# Patient Record
Sex: Female | Born: 1970 | Race: Asian | Hispanic: No | Marital: Married | Smoking: Never smoker
Health system: Southern US, Community
[De-identification: ages and names within clinical notes are randomized; demographics above are authoritative.]

---

## 2017-01-13 ENCOUNTER — Encounter (HOSPITAL_COMMUNITY): Payer: Self-pay | Admitting: Emergency Medicine

## 2017-01-13 ENCOUNTER — Emergency Department (HOSPITAL_COMMUNITY): Payer: Medicaid Other

## 2017-01-13 ENCOUNTER — Emergency Department (HOSPITAL_COMMUNITY)
Admission: EM | Admit: 2017-01-13 | Discharge: 2017-01-13 | Disposition: A | Discharge status: Home / Self Care | Payer: Medicaid Other | Attending: Emergency Medicine | Admitting: Emergency Medicine

## 2017-01-13 DIAGNOSIS — R1032 Left lower quadrant pain: Secondary | ICD-10-CM

## 2017-01-13 DIAGNOSIS — R079 Chest pain, unspecified: Secondary | ICD-10-CM | POA: Insufficient documentation

## 2017-01-13 LAB — URINALYSIS, ROUTINE W REFLEX MICROSCOPIC
BILIRUBIN URINE: NEGATIVE
Glucose, UA: NEGATIVE mg/dL
Ketones, ur: NEGATIVE mg/dL
Leukocytes, UA: NEGATIVE
NITRITE: NEGATIVE
PROTEIN: NEGATIVE mg/dL
Specific Gravity, Urine: 1.01 (ref 1.005–1.030)
pH: 5 (ref 5.0–8.0)

## 2017-01-13 LAB — CBC
HEMATOCRIT: 41.9 % (ref 36.0–46.0)
Hemoglobin: 14 g/dL (ref 12.0–15.0)
MCH: 30.9 pg (ref 26.0–34.0)
MCHC: 33.4 g/dL (ref 30.0–36.0)
MCV: 92.5 fL (ref 78.0–100.0)
Platelets: 279 10*3/uL (ref 150–400)
RBC: 4.53 MIL/uL (ref 3.87–5.11)
RDW: 12.9 % (ref 11.5–15.5)
WBC: 5.6 10*3/uL (ref 4.0–10.5)

## 2017-01-13 LAB — COMPREHENSIVE METABOLIC PANEL
ALBUMIN: 4.5 g/dL (ref 3.5–5.0)
ALK PHOS: 56 U/L (ref 38–126)
ALT: 12 U/L — ABNORMAL LOW (ref 14–54)
AST: 24 U/L (ref 15–41)
Anion gap: 10 (ref 5–15)
BILIRUBIN TOTAL: 0.8 mg/dL (ref 0.3–1.2)
BUN: 7 mg/dL (ref 6–20)
CALCIUM: 9.7 mg/dL (ref 8.9–10.3)
CO2: 24 mmol/L (ref 22–32)
Chloride: 107 mmol/L (ref 101–111)
Creatinine, Ser: 0.72 mg/dL (ref 0.44–1.00)
GFR calc Af Amer: >60 mL/min (ref >60)
GFR calc non Af Amer: >60 mL/min (ref >60)
GLUCOSE: 97 mg/dL (ref 65–99)
Potassium: 3.6 mmol/L (ref 3.5–5.1)
Sodium: 141 mmol/L (ref 135–145)
TOTAL PROTEIN: 8.4 g/dL — AB (ref 6.5–8.1)

## 2017-01-13 LAB — I-STAT TROPONIN, ED: TROPONIN I, POC: 0 ng/mL (ref 0.00–0.08)

## 2017-01-13 LAB — I-STAT BETA HCG BLOOD, ED (MC, WL, AP ONLY): I-stat hCG, quantitative: <5 m[IU]/mL (ref <5)

## 2017-01-13 LAB — LIPASE, BLOOD: Lipase: 25 U/L (ref 11–51)

## 2017-01-13 LAB — PREGNANCY, URINE: PREG TEST UR: NEGATIVE

## 2017-01-13 MED ORDER — DICYCLOMINE HCL 20 MG PO TABS: 20.0000 mg | ORAL_TABLET | Freq: Two times a day (BID) | ORAL | 0 refills | Status: DC

## 2017-01-13 MED ORDER — IOPAMIDOL (ISOVUE-300) INJECTION 61%
INTRAVENOUS | Status: AC
  Administered 2017-01-13: 100 mL
  Filled 2017-01-13: qty 100

## 2017-01-13 MED ORDER — MORPHINE SULFATE (PF) 4 MG/ML IV SOLN
4.0000 mg | Freq: Once | INTRAVENOUS | Status: AC
  Administered 2017-01-13: 4 mg via INTRAVENOUS
  Filled 2017-01-13: qty 1

## 2017-01-13 MED ORDER — NAPROXEN 500 MG PO TABS: 500.0000 mg | ORAL_TABLET | Freq: Two times a day (BID) | ORAL | 0 refills | Status: DC

## 2017-01-13 MED ORDER — ONDANSETRON HCL 4 MG/2ML IJ SOLN
4.0000 mg | Freq: Once | INTRAMUSCULAR | Status: AC
  Administered 2017-01-13: 4 mg via INTRAVENOUS
  Filled 2017-01-13: qty 2

## 2017-01-13 NOTE — ED Provider Notes (Signed)
[facility] DEPT Provider Note   CSN: [id number] Arrival date & time: 01/13/17  1237     History   Chief Complaint Chief Complaint  Patient presents with  . Abdominal Pain    HPI Margaret Morales is a 46 y.o. female.  HPI Margaret Morales is a 46 y.o. female Burmese speaking only, with no medical problems, presents to emergency department complaining of abdominal pain. Patient states she has had left lower quadrant abdominal pain for approximately a month. Patient states pain has been constant, sharp. It radiates into the entire abdomen, back, left chest. Patient has not been taking any medications for this pain. He she denies any constipation or diarrhea. Last BM was 2 days ago which is not unusual for her. No nausea or vomiting. Normal appetite. Denies any urinary symptoms or vaginal discharge or bleeding. Last menstrual cycle was one week ago.  She states she took a pregnancy test at home and it was negative. She denies any prior similar symptoms. No prior abdominal surgeries. Currently no PCP.  History reviewed. No pertinent past medical history.  There are no active problems to display for this patient.   No past surgical history on file.  OB History    No data available       Home Medications    Prior to Admission medications   Not on File    Family History No family history on file.  Social History Social History  Substance Use Topics  . Smoking status: Not on file  . Smokeless tobacco: Not on file  . Alcohol use Not on file     Allergies   Patient has no allergy information on record.   Review of Systems Review of Systems  Constitutional: Negative for chills and fever.  Respiratory: Negative for cough, chest tightness and shortness of breath.   Cardiovascular: Negative for chest pain, palpitations and leg swelling.  Gastrointestinal: Positive for abdominal pain. Negative for diarrhea, nausea and vomiting.  Genitourinary: Negative for dysuria, flank pain, pelvic  pain, vaginal bleeding, vaginal discharge and vaginal pain.  Musculoskeletal: Negative for arthralgias, myalgias, neck pain and neck stiffness.  Skin: Negative for rash.  Neurological: Negative for dizziness, weakness and headaches.  All other systems reviewed and are negative.    Physical Exam Updated Vital Signs BP (!) 165/80 (BP Location: Left Arm)   Pulse 69   Temp 98.2 F (36.8 C) (Oral)   Resp 16   LMP 09/13/2016   SpO2 100%   Physical Exam  Constitutional: She is oriented to person, place, and time. She appears well-developed and well-nourished. No distress.  HENT:  Head: Normocephalic.  Eyes: Conjunctivae are normal.  Neck: Neck supple.  Cardiovascular: Normal rate, regular rhythm and normal heart sounds.   Pulmonary/Chest: Effort normal and breath sounds normal. No respiratory distress. She has no wheezes. She has no rales.  Abdominal: Soft. Bowel sounds are normal. She exhibits no distension. There is tenderness. There is no rebound.  Left lower quadrant abdominal tenderness. Left CVA tenderness  Musculoskeletal: She exhibits no edema.  Neurological: She is alert and oriented to person, place, and time.  Skin: Skin is warm and dry.  Psychiatric: She has a normal mood and affect. Her behavior is normal.  Nursing note and vitals reviewed.    ED Treatments / Results  Labs (all labs ordered are listed, but only abnormal results are displayed) Labs Reviewed  COMPREHENSIVE METABOLIC PANEL - Abnormal; Notable for the following:       Result Value  Total Protein 8.4 (*)    ALT 12 (*)    All other components within normal limits  URINALYSIS, ROUTINE W REFLEX MICROSCOPIC - Abnormal; Notable for the following:    Hgb urine dipstick MODERATE (*)    Bacteria, UA RARE (*)    Squamous Epithelial / LPF 0-5 (*)    All other components within normal limits  LIPASE, BLOOD  CBC  PREGNANCY, URINE  POC URINE PREG, ED  I-STAT TROPONIN, ED  I-STAT BETA HCG BLOOD, ED ([facility], [facility],  [facility] ONLY)  I-STAT TROPONIN, ED    EKG  EKG Interpretation  Date/Time:  Thursday January 13 2017 18:21:02 EDT Ventricular Rate:  67 PR Interval:    QRS Duration: 89 QT Interval:  434 QTC Calculation: 459 R Axis:   83 Text Interpretation:  Sinus rhythm No old tracing to compare Confirmed by Linwood Dibbles [phone]) on 01/13/2017 7:07:27 PM Also confirmed by Linwood Dibbles [phone]), editor Elita Quick ([id number])  on 01/14/2017 8:04:32 AM       Radiology Ct Abdomen Pelvis W Contrast  Result Date: 01/13/2017 CLINICAL DATA:  Lower abdominal pain and tenderness for 1 month. EXAM: CT ABDOMEN AND PELVIS WITH CONTRAST TECHNIQUE: Multidetector CT imaging of the abdomen and pelvis was performed using the standard protocol following bolus administration of intravenous contrast. CONTRAST:  ISOVUE-300 IOPAMIDOL (ISOVUE-300) INJECTION 61% COMPARISON:  None. FINDINGS: Lower Chest: No acute findings. Hepatobiliary: No hepatic masses identified. Gallbladder is unremarkable. Pancreas:  No mass or inflammatory changes. Spleen: Within normal limits in size and appearance. Adrenals/Urinary Tract: No masses identified. No evidence of hydronephrosis. Stomach/Bowel: No evidence of obstruction, inflammatory process or abnormal fluid collections. Normal appendix visualized. Vascular/Lymphatic: No pathologically enlarged lymph nodes. No abdominal aortic aneurysm. Reproductive:  No mass or other significant abnormality. Other:  None. Musculoskeletal:  No suspicious bone lesions identified. IMPRESSION: Negative.  No acute findings or other significant abnormality. Electronically Signed   By: Myles Rosenthal M.D.   On: 01/13/2017 19:41    Procedures Procedures (including critical care time)  Medications Ordered in ED Medications  morphine 4 MG/ML injection 4 mg (not administered)  ondansetron (ZOFRAN) injection 4 mg (not administered)     Initial Impression / Assessment and Plan / ED Course  I have reviewed the triage  vital signs and the nursing notes.  Pertinent labs & imaging results that were available during my care of the patient were reviewed by me and considered in my medical decision making (see chart for details).     Patient with left-sided abdominal pain for a month. Labs obtained in triage unremarkable. Urinalysis negative other than moderate hemoglobin and urine. Will add a urine pregnancy test. Patient is also complaining of some chest pain, will add EKG and troponin. Pain medications, antiemetics, CT abdomen and pelvis ordered.   CT negative. Question musculoskeletal pain given worsening symptoms with movement. Discussed results with pt again using interpreter. Will try bentyl and NSAIDs. Will have her follow up with PCP. At this time, VS are stable, normal WBC, normal electrolytes. I do not think she needs any further imaging or treatment in ed and stable for outpatient work up.   Vitals:   01/13/17 1348 01/13/17 1851 01/13/17 1945  BP: (!) 165/80 (!) 149/83 (!) 154/90  Pulse: 69 67 81  Resp: 16 16 19   Temp: 98.2 F (36.8 C)    TempSrc: Oral    SpO2: 100% 98% 98%    Final Clinical Impressions(s) / ED Diagnoses   Final diagnoses:  Left lower quadrant pain    New Prescriptions Discharge Medication List as of 01/13/2017  8:02 PM    START taking these medications   Details  dicyclomine (BENTYL) 20 MG tablet Take 1 tablet (20 mg total) by mouth 2 (two) times daily., Starting Thu 01/13/2017, Print    naproxen (NAPROSYN) 500 MG tablet Take 1 tablet (500 mg total) by mouth 2 (two) times daily., Starting Thu 01/13/2017, Print         Jazsmin Couse, [place], PA-C 01/14/17 1130    Vanetta Mulders, MD 01/17/17 [phone]

## 2017-01-13 NOTE — ED Notes (Signed)
Pt understood dc material. NAD noted. SCripts given at dc. [employer] interp utilized

## 2017-01-13 NOTE — Discharge Instructions (Signed)
Take Naprosyn for pain. Bentyl for abdominal spasms. Please follow-up with the family doctor if symptoms not improving.

## 2017-01-13 NOTE — ED Triage Notes (Addendum)
Pt reports 1 month of left lower abdominal pain, painful upon palpation. Pt also states no menstrual cycle since April. But then states she had bloody discharge August 2nd for several days with pain relief, but pain came back Tuesday. Denies nausea vomiting. Denies painful urination. [employer] Translator used.

## 2017-01-13 NOTE — ED Notes (Signed)
Patient transported to CT on stretcher with tech. 

## 2017-02-09 ENCOUNTER — Other Ambulatory Visit: Payer: Self-pay | Admitting: Obstetrics & Gynecology

## 2017-02-09 DIAGNOSIS — Z1231 Encounter for screening mammogram for malignant neoplasm of breast: Secondary | ICD-10-CM

## 2017-03-10 ENCOUNTER — Encounter (HOSPITAL_COMMUNITY): Payer: Self-pay | Admitting: Emergency Medicine

## 2017-03-10 ENCOUNTER — Emergency Department (HOSPITAL_COMMUNITY)
Admission: EM | Admit: 2017-03-10 | Discharge: 2017-03-11 | Disposition: A | Discharge status: Home / Self Care | Payer: Self-pay | Attending: Emergency Medicine | Admitting: Emergency Medicine

## 2017-03-10 DIAGNOSIS — Z79899 Other long term (current) drug therapy: Secondary | ICD-10-CM | POA: Insufficient documentation

## 2017-03-10 DIAGNOSIS — K5900 Constipation, unspecified: Secondary | ICD-10-CM | POA: Insufficient documentation

## 2017-03-10 DIAGNOSIS — R1084 Generalized abdominal pain: Secondary | ICD-10-CM | POA: Insufficient documentation

## 2017-03-10 LAB — URINALYSIS, ROUTINE W REFLEX MICROSCOPIC
BACTERIA UA: NONE SEEN
BILIRUBIN URINE: NEGATIVE
Glucose, UA: NEGATIVE mg/dL
Ketones, ur: NEGATIVE mg/dL
LEUKOCYTES UA: NEGATIVE
NITRITE: NEGATIVE
PH: 6 (ref 5.0–8.0)
Protein, ur: NEGATIVE mg/dL
RBC / HPF: NONE SEEN RBC/hpf (ref 0–5)
SPECIFIC GRAVITY, URINE: 1.001 — AB (ref 1.005–1.030)

## 2017-03-10 LAB — CBC
HEMATOCRIT: 37.1 % (ref 36.0–46.0)
HEMOGLOBIN: 12.4 g/dL (ref 12.0–15.0)
MCH: 31.4 pg (ref 26.0–34.0)
MCHC: 33.4 g/dL (ref 30.0–36.0)
MCV: 93.9 fL (ref 78.0–100.0)
Platelets: 237 10*3/uL (ref 150–400)
RBC: 3.95 MIL/uL (ref 3.87–5.11)
RDW: 12.5 % (ref 11.5–15.5)
WBC: 4.8 10*3/uL (ref 4.0–10.5)

## 2017-03-10 LAB — COMPREHENSIVE METABOLIC PANEL
ALT: 12 U/L — ABNORMAL LOW (ref 14–54)
ANION GAP: 7 (ref 5–15)
AST: 21 U/L (ref 15–41)
Albumin: 3.9 g/dL (ref 3.5–5.0)
Alkaline Phosphatase: 57 U/L (ref 38–126)
BILIRUBIN TOTAL: 0.8 mg/dL (ref 0.3–1.2)
BUN: 6 mg/dL (ref 6–20)
CHLORIDE: 103 mmol/L (ref 101–111)
CO2: 25 mmol/L (ref 22–32)
Calcium: 9.5 mg/dL (ref 8.9–10.3)
Creatinine, Ser: 0.83 mg/dL (ref 0.44–1.00)
Glucose, Bld: 130 mg/dL — ABNORMAL HIGH (ref 65–99)
POTASSIUM: 3.8 mmol/L (ref 3.5–5.1)
Sodium: 135 mmol/L (ref 135–145)
TOTAL PROTEIN: 7.7 g/dL (ref 6.5–8.1)

## 2017-03-10 LAB — HCG, QUANTITATIVE, PREGNANCY: HCG, BETA CHAIN, QUANT, S: 2 m[IU]/mL (ref <5)

## 2017-03-10 LAB — PREGNANCY, URINE: PREG TEST UR: NEGATIVE

## 2017-03-10 LAB — LIPASE, BLOOD: LIPASE: 28 U/L (ref 11–51)

## 2017-03-10 MED ORDER — IOPAMIDOL (ISOVUE-300) INJECTION 61%
INTRAVENOUS | Status: AC
  Administered 2017-03-11: 100 mL
  Filled 2017-03-10: qty 100

## 2017-03-10 MED ORDER — GI COCKTAIL ~~LOC~~
30.0000 mL | Freq: Once | ORAL | Status: AC
  Administered 2017-03-10: 30 mL via ORAL
  Filled 2017-03-10: qty 30

## 2017-03-10 MED ORDER — MORPHINE SULFATE (PF) 4 MG/ML IV SOLN
4.0000 mg | Freq: Once | INTRAVENOUS | Status: AC
  Administered 2017-03-10: 4 mg via INTRAVENOUS
  Filled 2017-03-10: qty 1

## 2017-03-10 MED ORDER — SODIUM CHLORIDE 0.9 % IV BOLUS (SEPSIS)
1000.0000 mL | Freq: Once | INTRAVENOUS | Status: AC
  Administered 2017-03-10: 1000 mL via INTRAVENOUS

## 2017-03-10 NOTE — ED Provider Notes (Signed)
[facility] Provider Note   CSN: [id number] Arrival date & time: 03/10/17  1657     History   Chief Complaint Chief Complaint  Patient presents with  . Abdominal Pain    HPI Margaret Morales is a 46 y.o. female.  The history is provided by the patient, medical records and a relative. The history is limited by a language barrier.  Abdominal Pain   This is a chronic problem. The current episode started more than 1 week ago. The problem occurs constantly. The problem has not changed since onset.The pain is associated with eating. The pain is located in the generalized abdominal region. The quality of the pain is aching, cramping, sharp and burning. The pain is moderate. Associated symptoms include constipation. Pertinent negatives include fever, diarrhea, melena, nausea, vomiting, dysuria, frequency and headaches. The symptoms are aggravated by palpation and eating (walking). Nothing relieves the symptoms.    History reviewed. No pertinent past medical history.  There are no active problems to display for this patient.   History reviewed. No pertinent surgical history.  OB History    No data available       Home Medications    Prior to Admission medications   Medication Sig Start Date End Date Taking? Authorizing Provider  dicyclomine (BENTYL) 20 MG tablet Take 1 tablet (20 mg total) by mouth 2 (two) times daily. 01/13/17   Kirichenko, Lemont Fillers, PA-C  Multiple Vitamin (MULTI-VITAMIN DAILY PO) Take 1 tablet by mouth daily.    [provider]  naproxen (NAPROSYN) 500 MG tablet Take 1 tablet (500 mg total) by mouth 2 (two) times daily. 01/13/17   Jaynie Crumble, PA-C    Family History History reviewed. No pertinent family history.  Social History Social History  Substance Use Topics  . Smoking status: Never Smoker  . Smokeless tobacco: Never Used  . Alcohol use No     Allergies   Patient has no known allergies.   Review of Systems Review of Systems    Constitutional: Negative for chills, diaphoresis, fatigue and fever.  HENT: Negative for congestion and rhinorrhea.   Respiratory: Negative for cough, chest tightness, shortness of breath and wheezing.   Cardiovascular: Negative for chest pain and palpitations.  Gastrointestinal: Positive for abdominal pain and constipation. Negative for abdominal distention, diarrhea, melena, nausea and vomiting.  Genitourinary: Negative for dysuria, flank pain and frequency.  Musculoskeletal: Negative for back pain, neck pain and neck stiffness.  Skin: Negative for wound.  Neurological: Negative for light-headedness and headaches.  Psychiatric/Behavioral: Negative for agitation.  All other systems reviewed and are negative.    Physical Exam Updated Vital Signs BP (!) 157/85 (BP Location: Left Arm)   Pulse 64   Temp 98.1 F (36.7 C) (Oral)   Resp 18   LMP 09/13/2016   SpO2 100%   Physical Exam  Constitutional: She appears well-developed and well-nourished. No distress.  HENT:  Head: Normocephalic and atraumatic.  Mouth/Throat: Oropharynx is clear and moist. No oropharyngeal exudate.  Eyes: Conjunctivae are normal.  Neck: Neck supple.  Cardiovascular: Normal rate and intact distal pulses.   No murmur heard. Pulmonary/Chest: Effort normal and breath sounds normal. No stridor. No respiratory distress.  Abdominal: Soft. There is generalized tenderness. There is no rigidity, no rebound, no guarding and no CVA tenderness.  Musculoskeletal: She exhibits no edema or tenderness.  Neurological: She is alert. No sensory deficit. She exhibits normal muscle tone.  Skin: Skin is warm and dry. Capillary refill takes less than 2 seconds.  No rash noted. She is not diaphoretic. No erythema.  Psychiatric: She has a normal mood and affect.  Nursing note and vitals reviewed.    ED Treatments / Results  Labs (all labs ordered are listed, but only abnormal results are displayed) Labs Reviewed   COMPREHENSIVE METABOLIC PANEL - Abnormal; Notable for the following:       Result Value   Glucose, Bld 130 (*)    ALT 12 (*)    All other components within normal limits  URINALYSIS, ROUTINE W REFLEX MICROSCOPIC - Abnormal; Notable for the following:    Color, Urine COLORLESS (*)    Specific Gravity, Urine 1.001 (*)    Hgb urine dipstick SMALL (*)    Squamous Epithelial / LPF 0-5 (*)    All other components within normal limits  LIPASE, BLOOD  CBC  HCG, QUANTITATIVE, PREGNANCY  PREGNANCY, URINE    EKG  EKG Interpretation None       Radiology No results found.  Procedures Procedures (including critical care time)  Medications Ordered in ED Medications  sodium chloride 0.9 % bolus 1,000 mL (0 mLs Intravenous Stopped 03/10/17 2238)  gi cocktail (Maalox,Lidocaine,Donnatal) (30 mLs Oral Given 03/10/17 2128)  morphine 4 MG/ML injection 4 mg (4 mg Intravenous Given 03/10/17 2128)     Initial Impression / Assessment and Plan / ED Course  I have reviewed the triage vital signs and the nursing notes.  Pertinent labs & imaging results that were available during my care of the patient were reviewed by me and considered in my medical decision making (see chart for details).     Margaret Morales is a 46 y.o. female with no significant past medical history who presents with abdominal pain and constipation. Patient says that she has had abdominal pain that has been persistent for the last 3 months. She says that it is worsened when she eats solid foods. She reports that she has only been drinking fluids and soups for the last month. She says it is painful when she walks or moves. She denies nausea or vomiting. She denies diarrhea or urinary symptoms but does report some constipation. She reports she has been having a bowel movement only once every 3 or 4 days which is abnormal for her. She denies vaginal discharge or vaginal bleeding. She describes the pain as cramping and burning all across her  abdomen. She reports that she was seen within the last 2 months and had left lower quadrant pain at that time. She says it isn't spread all across her abdomen. She has not had any relief from home medications.  On exam, patient has diffuse abdominal tenderness. Patient has no CVA tenderness. Patients lungs were clear and chest was nontender. No focal neurologic deficits.   Based on the patient's tenderness on exam, patient will have laboratory testing and imaging performed. Patient will be given GI cocktail and pain medications.  Patient's initial laboratory testing showed a normal lipase. CBC unremarkable. CMP showed no significant molecular antibodies or kidney dysfunction. LFTs not elevated. Urinalysis showed no evidence of infection but there was some small hemoglobin. Regnancy test negative.   Patient will have CT scan to further evaluate while her symptom management medications are provided.     Care transferred to Dr. Judd Lien while awaiting CT results. If CT is reassuring, anticipate pt stable for discharge home. Care transferred ins table condition.     Final Clinical Impressions(s) / ED Diagnoses   Final diagnoses:  Generalized abdominal pain  Clinical Impression: 1. Generalized abdominal pain     Disposition: Care transferred to Dr. Judd Lien.     Zoila Ditullio, Canary Brim, MD 03/11/17 1014

## 2017-03-10 NOTE — ED Notes (Signed)
Pt states abdominal pain has been going on for 3 months.  She has only been eating/drinking liquids because she says solid foods upset her stomach and makes it hurt.  Denies dysuria.  Denies N/V/D.  States she only has a BM every 3-4 days.

## 2017-03-10 NOTE — ED Triage Notes (Signed)
Pt to ER for evaluation of abdominal pain x3 months. Denies nausea, vomiting, or diarrhea. States was seen here in July for same.

## 2017-03-11 ENCOUNTER — Emergency Department (HOSPITAL_COMMUNITY): Payer: Self-pay

## 2017-03-11 NOTE — Discharge Instructions (Signed)
Tylenol 1000 mg every 6 hours as needed for pain.  Return to the emergency department if you develop worsening pain, high fever, bloody stools, or other new and concerning symptoms.

## 2017-03-11 NOTE — ED Provider Notes (Signed)
Care assumed from Dr. Rush Landmark at shift change. Patient presents here with ongoing abdominal pain for the past several months. A CT scan was obtained to rule out any significant intra-abdominal pathology. This was performed and was normal. I see no indication for further workup. She will be discharged, to return as needed for any problems.   Geoffery Lyons, MD 03/11/17 [phone]

## 2018-11-19 ENCOUNTER — Encounter (HOSPITAL_COMMUNITY): Payer: Self-pay | Admitting: Emergency Medicine

## 2018-11-19 ENCOUNTER — Ambulatory Visit (HOSPITAL_COMMUNITY)
Admission: EM | Admit: 2018-11-19 | Discharge: 2018-11-19 | Disposition: A | Discharge status: Home / Self Care | Payer: Medicaid Other | Attending: Emergency Medicine | Admitting: Emergency Medicine

## 2018-11-19 DIAGNOSIS — K219 Gastro-esophageal reflux disease without esophagitis: Secondary | ICD-10-CM

## 2018-11-19 MED ORDER — ALUM & MAG HYDROXIDE-SIMETH 200-200-20 MG/5ML PO SUSP
ORAL | Status: AC
  Filled 2018-11-19: qty 30

## 2018-11-19 MED ORDER — LIDOCAINE VISCOUS HCL 2 % MT SOLN
OROMUCOSAL | Status: AC
  Filled 2018-11-19: qty 15

## 2018-11-19 MED ORDER — LIDOCAINE VISCOUS HCL 2 % MT SOLN
15.0000 mL | Freq: Once | OROMUCOSAL | Status: AC
  Administered 2018-11-19: 15 mL via ORAL

## 2018-11-19 MED ORDER — OMEPRAZOLE 20 MG PO CPDR: 20.0000 mg | DELAYED_RELEASE_CAPSULE | Freq: Every day | ORAL | 0 refills | Status: DC

## 2018-11-19 MED ORDER — ALUM & MAG HYDROXIDE-SIMETH 200-200-20 MG/5ML PO SUSP
30.0000 mL | Freq: Once | ORAL | Status: AC
  Administered 2018-11-19: 30 mL via ORAL

## 2018-11-19 NOTE — ED Provider Notes (Signed)
[facility]    CSN: [id number] Arrival date & time: 11/19/18  1625     History   Chief Complaint Chief Complaint  Patient presents with  . Epigastric Pain    HPI Margaret Morales is a 48 y.o. female.   Margaret Morales presents with complaints of epigastric pain which radiates up to throat. Started a week ago, worse since yesterday. Pain is worse when she lays flat, at night. No nasal drainage or cough. Feels like when she lays flat she needs to cough, however. Has tried tylenol which hasn't helped. No nausea, vomiting, constipation or diarrhea. Denies any previous similar. Today feels chilled but no specific fevers. Not currently working. No known ill contacts. No family members exposed to ill contacts. No specific known food triggers. Some ear pain. No back pain, no dizziness, no chest or arm pain, no diaphoresis. Without contributing medical history.     Burmese audio interpreter used to collect history and physical exam.    ROS per HPI, negative if not otherwise mentioned.      History reviewed. No pertinent past medical history.  There are no active problems to display for this patient.   History reviewed. No pertinent surgical history.  OB History   No obstetric history on file.      Home Medications    Prior to Admission medications   Medication Sig Start Date End Date Taking? Authorizing Provider  dicyclomine (BENTYL) 20 MG tablet Take 1 tablet (20 mg total) by mouth 2 (two) times daily. Patient not taking: Reported on 03/10/2017 01/13/17   Jeannett Senior, PA-C  Multiple Vitamin (MULTI-VITAMIN DAILY PO) Take 1 tablet by mouth daily.    [provider]  omeprazole (PRILOSEC) 20 MG capsule Take 1 capsule (20 mg total) by mouth daily. 11/19/18   Zigmund Gottron, NP  omeprazole (PRILOSEC OTC) 20 MG tablet Take 20 mg by mouth 2 (two) times daily.  11/19/18  [provider]    Family History No family history on file.  Social History Social  History   Tobacco Use  . Smoking status: Never Smoker  . Smokeless tobacco: Never Used  Substance Use Topics  . Alcohol use: No  . Drug use: Not on file     Allergies   Patient has no known allergies.   Review of Systems Review of Systems   Physical Exam Triage Vital Signs ED Triage Vitals [11/19/18 1649]  Enc Vitals Group     BP (!) 167/107     Pulse Rate 84     Resp 16     Temp 99 F (37.2 C)     Temp src      SpO2 97 %     Weight      Height      Head Circumference      Peak Flow      Pain Score      Pain Loc      Pain Edu?      Excl. in [place]?    No data found.  Updated Vital Signs BP (!) 167/107   Pulse 84   Temp 99 F (37.2 C)   Resp 16   LMP 09/13/2016   SpO2 97%    Physical Exam Constitutional:      General: She is not in acute distress.    Appearance: She is well-developed.  Cardiovascular:     Rate and Rhythm: Normal rate and regular rhythm.     Heart sounds: Normal heart  sounds.  Pulmonary:     Effort: Pulmonary effort is normal.     Breath sounds: Normal breath sounds.  Abdominal:     Tenderness: There is abdominal tenderness in the epigastric area.  Skin:    General: Skin is warm and dry.  Neurological:     Mental Status: She is alert and oriented to person, place, and time.      UC Treatments / Results  Labs (all labs ordered are listed, but only abnormal results are displayed) Labs Reviewed - No data to display  EKG None  Radiology No results found.  Procedures Procedures (including critical care time)  Medications Ordered in UC Medications  alum & mag hydroxide-simeth (MAALOX/MYLANTA) 200-200-20 MG/5ML suspension 30 mL (30 mLs Oral Given 11/19/18 1720)    And  lidocaine (XYLOCAINE) 2 % viscous mouth solution 15 mL (15 mLs Oral Given 11/19/18 1720)  alum & mag hydroxide-simeth (MAALOX/MYLANTA) 200-200-20 MG/5ML suspension (has no administration in time range)  lidocaine (XYLOCAINE) 2 % viscous mouth solution (has no  administration in time range)    Initial Impression / Assessment and Plan / UC Course  I have reviewed the triage vital signs and the nursing notes.  Pertinent labs & imaging results that were available during my care of the patient were reviewed by me and considered in my medical decision making (see chart for details).     H&P consistent with reflux symptoms. Per chart review has had omeprazole in the past, no longer taking. Gi cocktail provided in clinic today to restart omeprazole. Dietary recommendations provided. Low risk for covid, low risk acs. Return precautions provided. Patient verbalized understanding and agreeable to plan.   Final Clinical Impressions(s) / UC Diagnoses   Final diagnoses:  Gastroesophageal reflux disease, esophagitis presence not specified   Discharge Instructions   None    ED Prescriptions    Medication Sig Dispense Auth. Provider   omeprazole (PRILOSEC) 20 MG capsule Take 1 capsule (20 mg total) by mouth daily. 30 capsule Georgetta HaberBurky, Natalie B, NP     Controlled Substance Prescriptions [place] Controlled Substance Registry consulted? Not Applicable   Georgetta HaberBurky, Natalie B, NP 11/19/18 1733

## 2018-11-19 NOTE — ED Triage Notes (Signed)
Pt c/o sore throat, tickle in her throat, for a week, pt c/o burning in her neck. Pt states "when I open my mouth the heat goes out".

## 2018-12-01 ENCOUNTER — Ambulatory Visit (INDEPENDENT_AMBULATORY_CARE_PROVIDER_SITE_OTHER): Payer: Self-pay

## 2018-12-01 ENCOUNTER — Other Ambulatory Visit: Payer: Self-pay

## 2018-12-01 ENCOUNTER — Encounter (HOSPITAL_COMMUNITY): Payer: Self-pay

## 2018-12-01 ENCOUNTER — Ambulatory Visit (HOSPITAL_COMMUNITY)
Admission: EM | Admit: 2018-12-01 | Discharge: 2018-12-01 | Disposition: A | Discharge status: Home / Self Care | Payer: Self-pay | Attending: Internal Medicine | Admitting: Internal Medicine

## 2018-12-01 DIAGNOSIS — Z72820 Sleep deprivation: Secondary | ICD-10-CM

## 2018-12-01 DIAGNOSIS — K219 Gastro-esophageal reflux disease without esophagitis: Secondary | ICD-10-CM

## 2018-12-01 MED ORDER — ESOMEPRAZOLE MAGNESIUM 40 MG PO CPDR: 40.0000 mg | DELAYED_RELEASE_CAPSULE | Freq: Every day | ORAL | 0 refills | Status: DC

## 2018-12-01 MED ORDER — DIPHENHYDRAMINE HCL 25 MG PO TABS: 25.0000 mg | ORAL_TABLET | Freq: Every evening | ORAL | 0 refills | Status: DC | PRN

## 2018-12-01 MED ORDER — FAMOTIDINE 20 MG PO TABS: 20.0000 mg | ORAL_TABLET | Freq: Every day | ORAL | 0 refills | Status: DC

## 2018-12-01 MED ORDER — ALUM & MAG HYDROXIDE-SIMETH 200-200-20 MG/5ML PO SUSP
ORAL | Status: AC
  Filled 2018-12-01: qty 30

## 2018-12-01 MED ORDER — LIDOCAINE VISCOUS HCL 2 % MT SOLN
15.0000 mL | Freq: Once | OROMUCOSAL | Status: AC
  Administered 2018-12-01: 15 mL via ORAL

## 2018-12-01 MED ORDER — ALUM & MAG HYDROXIDE-SIMETH 200-200-20 MG/5ML PO SUSP
30.0000 mL | Freq: Once | ORAL | Status: AC
  Administered 2018-12-01: 30 mL via ORAL

## 2018-12-01 MED ORDER — LIDOCAINE VISCOUS HCL 2 % MT SOLN
OROMUCOSAL | Status: AC
  Filled 2018-12-01: qty 15

## 2018-12-01 NOTE — ED Triage Notes (Signed)
Patient presents to Urgent Care with complaints of not being able to sleep for three weeks since feeling like her throat and stomach is "hot". Patient reports she has been too uncomfortable to sleep well.

## 2018-12-01 NOTE — Discharge Instructions (Addendum)
Your chest xray is normal today which is reassuring.  It sounds like reflux is still contributing to your chest pain and burning.  We will try switching your regimen to try to get better control.  Nexium once a day in the morning. Pepcid at night before bed. Benadryl as needed to help with sleep.  See diet recommendations provided.  Please follow up with PCP as may need further evaluation if symptoms persist.

## 2018-12-01 NOTE — ED Provider Notes (Signed)
[facility]    CSN: [id number] Arrival date & time: 12/01/18  1024     History   Chief Complaint Chief Complaint  Patient presents with  . Insomnia    HPI Margaret Morales is a 48 y.o. female.   Margaret Morales presents with complaints of chest burning and throat itching, which is much worse at night, and has been ongoing for the past three weeks. Was seen here and started on omeprazole 6/14. Symptoms have improved during the day but still with symptoms at night, keeping her up at night. No headache. No fevers. No leg swelling. No cough. Shortness of breath sensation with laying flat. Occasional cough. Doesn't follow with a PCP. Has some burning pain now even. No nausea or vomiting. Doesn't smoke. No palpitations.    Burmese audio interpreter used to collect history and physical exam.    ROS per HPI, negative if not otherwise mentioned.      History reviewed. No pertinent past medical history.  There are no active problems to display for this patient.   History reviewed. No pertinent surgical history.  OB History   No obstetric history on file.      Home Medications    Prior to Admission medications   Medication Sig Start Date End Date Taking? Authorizing Provider  diphenhydrAMINE (BENADRYL) 25 MG tablet Take 1 tablet (25 mg total) by mouth at bedtime as needed for sleep. 12/01/18   Georgetta HaberBurky, Fionnuala Hemmerich B, NP  esomeprazole (NEXIUM) 40 MG capsule Take 1 capsule (40 mg total) by mouth daily. 12/01/18   Georgetta HaberBurky, Gaylon Bentz B, NP  famotidine (PEPCID) 20 MG tablet Take 1 tablet (20 mg total) by mouth at bedtime. 12/01/18   Georgetta HaberBurky, Jenaro Souder B, NP  Multiple Vitamin (MULTI-VITAMIN DAILY PO) Take 1 tablet by mouth daily.    [provider]  dicyclomine (BENTYL) 20 MG tablet Take 1 tablet (20 mg total) by mouth 2 (two) times daily. Patient not taking: Reported on 03/10/2017 01/13/17 12/01/18  Jaynie CrumbleKirichenko, Tatyana, PA-C  omeprazole (PRILOSEC OTC) 20 MG tablet Take 20 mg by mouth 2 (two) times  daily.  11/19/18  [provider]  omeprazole (PRILOSEC) 20 MG capsule Take 1 capsule (20 mg total) by mouth daily. 11/19/18 12/01/18  Georgetta HaberBurky, Toiya Morrish B, NP    Family History Family History  Family history unknown: Yes    Social History Social History   Tobacco Use  . Smoking status: Never Smoker  . Smokeless tobacco: Never Used  Substance Use Topics  . Alcohol use: No  . Drug use: Not on file     Allergies   Patient has no known allergies.   Review of Systems Review of Systems   Physical Exam Triage Vital Signs ED Triage Vitals  Enc Vitals Group     BP 12/01/18 1105 (!) 153/82     Pulse Rate 12/01/18 1105 73     Resp 12/01/18 1105 17     Temp 12/01/18 1105 98.5 F (36.9 C)     Temp Source 12/01/18 1105 Oral     SpO2 12/01/18 1105 98 %     Weight --      Height --      Head Circumference --      Peak Flow --      Pain Score 12/01/18 1307 5     Pain Loc --      Pain Edu? --      Excl. in GC? --    No data found.  Updated  Vital Signs BP (!) 153/82 (BP Location: Right Arm)   Pulse 73   Temp 98.5 F (36.9 C) (Oral)   Resp 17   LMP 09/13/2016   SpO2 98%   Visual Acuity Right Eye Distance:   Left Eye Distance:   Bilateral Distance:    Right Eye Near:   Left Eye Near:    Bilateral Near:     Physical Exam Constitutional:      General: She is not in acute distress.    Appearance: She is well-developed.  Cardiovascular:     Rate and Rhythm: Normal rate.  Pulmonary:     Effort: Pulmonary effort is normal.  Abdominal:     Tenderness: There is abdominal tenderness in the epigastric area.  Skin:    General: Skin is warm and dry.  Neurological:     Mental Status: She is alert and oriented to person, place, and time.      UC Treatments / Results  Labs (all labs ordered are listed, but only abnormal results are displayed) Labs Reviewed - No data to display  EKG None  Radiology Dg Chest 2 View  Result Date: 12/01/2018 CLINICAL DATA:   Chest pain. EXAM: CHEST - 2 VIEW COMPARISON:  02/09/2008. FINDINGS: Mediastinum and hilar structures normal. Low lung volumes with mild bibasilar atelectasis. No pleural effusion or pneumothorax. Heart size normal. No acute bony abnormality. IMPRESSION: No acute cardiopulmonary disease. Electronically Signed   By: Marcello Moores  Register   On: 12/01/2018 12:26    Procedures Procedures (including critical care time)  Medications Ordered in UC Medications  alum & mag hydroxide-simeth (MAALOX/MYLANTA) 200-200-20 MG/5ML suspension 30 mL (30 mLs Oral Given 12/01/18 1150)    And  lidocaine (XYLOCAINE) 2 % viscous mouth solution 15 mL (15 mLs Oral Given 12/01/18 1150)  alum & mag hydroxide-simeth (MAALOX/MYLANTA) 200-200-20 MG/5ML suspension (has no administration in time range)  lidocaine (XYLOCAINE) 2 % viscous mouth solution (has no administration in time range)    Initial Impression / Assessment and Plan / UC Course  I have reviewed the triage vital signs and the nursing notes.  Pertinent labs & imaging results that were available during my care of the patient were reviewed by me and considered in my medical decision making (see chart for details).     Chest xray reassuring. Vitals stable. Non toxic. Omeprazole has helped some. Will try daily nexium and pepcid qhs. Benadryl prn. May need h.pylori testing, emphasized to establish with PCP. Patient verbalized understanding and agreeable to plan.   Final Clinical Impressions(s) / UC Diagnoses   Final diagnoses:  Gastroesophageal reflux disease, esophagitis presence not specified  Poor sleep     Discharge Instructions     Your chest xray is normal today which is reassuring.  It sounds like reflux is still contributing to your chest pain and burning.  We will try switching your regimen to try to get better control.  Nexium once a day in the morning. Pepcid at night before bed. Benadryl as needed to help with sleep.  See diet recommendations  provided.  Please follow up with PCP as may need further evaluation if symptoms persist.     ED Prescriptions    Medication Sig Dispense Auth. Provider   esomeprazole (NEXIUM) 40 MG capsule Take 1 capsule (40 mg total) by mouth daily. 30 capsule Augusto Gamble B, NP   famotidine (PEPCID) 20 MG tablet Take 1 tablet (20 mg total) by mouth at bedtime. 30 tablet Zigmund Gottron, NP  diphenhydrAMINE (BENADRYL) 25 MG tablet Take 1 tablet (25 mg total) by mouth at bedtime as needed for sleep. 20 tablet Georgetta HaberBurky, Leocadia Idleman B, NP     Controlled Substance Prescriptions [place] Controlled Substance Registry consulted? Not Applicable   Georgetta HaberBurky, Aubryn Spinola B, NP 12/01/18 1312

## 2018-12-25 ENCOUNTER — Encounter (HOSPITAL_COMMUNITY): Payer: Self-pay

## 2018-12-25 ENCOUNTER — Other Ambulatory Visit: Payer: Self-pay

## 2018-12-25 ENCOUNTER — Ambulatory Visit (HOSPITAL_COMMUNITY)
Admission: EM | Admit: 2018-12-25 | Discharge: 2018-12-25 | Disposition: A | Discharge status: Home / Self Care | Payer: Medicaid Other | Attending: Family Medicine | Admitting: Family Medicine

## 2018-12-25 DIAGNOSIS — R1013 Epigastric pain: Secondary | ICD-10-CM

## 2018-12-25 DIAGNOSIS — G8929 Other chronic pain: Secondary | ICD-10-CM

## 2018-12-25 MED ORDER — OMEPRAZOLE 20 MG PO CPDR: 20.0000 mg | DELAYED_RELEASE_CAPSULE | Freq: Every day | ORAL | 0 refills | Status: DC

## 2018-12-25 MED ORDER — SUCRALFATE 1 G PO TABS: 1.0000 g | ORAL_TABLET | Freq: Three times a day (TID) | ORAL | 0 refills | Status: DC

## 2018-12-25 NOTE — ED Triage Notes (Signed)
Pt presents for medication refill on medication for recurrent abdominal pain .

## 2018-12-25 NOTE — Discharge Instructions (Signed)
Take the omeprazole 2 x a day morning and night.  This reduces stomach acid  Call tomorrow to set appointment for internal medicine doctor .  You need to see a doctor on an ongoing basis for this problem.  It cannot be thoroughly evaluated at an ER or urgent care.  You may need a specialist.

## 2018-12-25 NOTE — ED Provider Notes (Signed)
[facility]    CSN: [id number] Arrival date & time: 12/25/18  1526      History   Chief Complaint Chief Complaint  Patient presents with  . Medication Refill  . Abdominal Pain    HPI Margaret Morales is a 48 y.o. female.   HPI  Patient has chronic abdominal pain.  This is her fourth or fifth visit to the urgent care center in ER for same.  She has had 2 CT scans which were negative.  She is had blood work which is normal.  She has been tried on Bentyl, omeprazole, Nexium, Pepcid.  She complains of epigastric pain.  She states her stomach hurts if she eats.  It hurts if she does not.  It hurts at night.  She puts her fist in her epigastrium and and presses to indicate she has pressure in this area.  Her son is here to help interpret.  We are unable to reach a Burmese interpreter on the usual translating lines. No loss of weight.  No nausea or vomiting.  No diarrhea.  No occasional constipation. She has been avoiding spicy foods.  Fatty foods.  Concentrated sweets. She states that 1 of the medicines it was given to her.  To help.  She is requesting something inexpensive since she does not have insurance.  History reviewed. No pertinent past medical history.  There are no active problems to display for this patient.   History reviewed. No pertinent surgical history.  OB History   No obstetric history on file.      Home Medications    Prior to Admission medications   Medication Sig Start Date End Date Taking? Authorizing Provider  diphenhydrAMINE (BENADRYL) 25 MG tablet Take 1 tablet (25 mg total) by mouth at bedtime as needed for sleep. 12/01/18   Georgetta HaberBurky, Natalie B, NP  esomeprazole (NEXIUM) 40 MG capsule Take 1 capsule (40 mg total) by mouth daily. 12/01/18   Georgetta HaberBurky, Natalie B, NP  famotidine (PEPCID) 20 MG tablet Take 1 tablet (20 mg total) by mouth at bedtime. 12/01/18   Georgetta HaberBurky, Natalie B, NP  Multiple Vitamin (MULTI-VITAMIN DAILY PO) Take 1 tablet by mouth daily.     [provider]  omeprazole (PRILOSEC) 20 MG capsule Take 1 capsule (20 mg total) by mouth daily. 12/25/18   Eustace MooreNelson, Kaymarie Wynn Sue, MD  dicyclomine (BENTYL) 20 MG tablet Take 1 tablet (20 mg total) by mouth 2 (two) times daily. Patient not taking: Reported on 03/10/2017 01/13/17 12/01/18  Jaynie CrumbleKirichenko, Tatyana, PA-C  omeprazole (PRILOSEC OTC) 20 MG tablet Take 20 mg by mouth 2 (two) times daily.  11/19/18  [provider]  sucralfate (CARAFATE) 1 g tablet Take 1 tablet (1 g total) by mouth 4 (four) times daily -  with meals and at bedtime. 12/25/18 12/25/18  Eustace MooreNelson, Lilana Blasko Sue, MD    Family History Family History  Family history unknown: Yes    Social History Social History   Tobacco Use  . Smoking status: Never Smoker  . Smokeless tobacco: Never Used  Substance Use Topics  . Alcohol use: No  . Drug use: Not on file     Allergies   Patient has no known allergies.   Review of Systems Review of Systems  Constitutional: Negative for chills and fever.  HENT: Negative for ear pain and sore throat.   Eyes: Negative for pain and visual disturbance.  Respiratory: Negative for cough and shortness of breath.   Cardiovascular: Negative for chest pain and  palpitations.  Gastrointestinal: Positive for abdominal pain. Negative for vomiting.  Genitourinary: Negative for dysuria and hematuria.  Musculoskeletal: Negative for arthralgias and back pain.  Skin: Negative for color change and rash.  Neurological: Negative for seizures and syncope.  All other systems reviewed and are negative.    Physical Exam Triage Vital Signs ED Triage Vitals  Enc Vitals Group     BP 12/25/18 1614 (!) 150/96     Pulse Rate 12/25/18 1614 81     Resp 12/25/18 1614 17     Temp 12/25/18 1614 98.7 F (37.1 C)     Temp Source 12/25/18 1614 Oral     SpO2 12/25/18 1614 99 %     Weight --      Height --      Head Circumference --      Peak Flow --      Pain Score 12/25/18 1629 6     Pain Loc --       Pain Edu? --      Excl. in GC? --    No data found.  Updated Vital Signs BP (!) 150/96 (BP Location: Left Arm)   Pulse 81   Temp 98.7 F (37.1 C) (Oral)   Resp 17   LMP 09/13/2016   SpO2 99%     Physical Exam Constitutional:      General: She is not in acute distress.    Appearance: She is well-developed. She is obese.  HENT:     Head: Normocephalic and atraumatic.  Eyes:     Conjunctiva/sclera: Conjunctivae normal.     Pupils: Pupils are equal, round, and reactive to light.  Neck:     Musculoskeletal: Normal range of motion.  Cardiovascular:     Rate and Rhythm: Normal rate and regular rhythm.     Heart sounds: Normal heart sounds.  Pulmonary:     Effort: Pulmonary effort is normal. No respiratory distress.     Breath sounds: Normal breath sounds.  Abdominal:     General: Abdomen is flat. Bowel sounds are normal. There is no distension.     Palpations: Abdomen is soft. There is no hepatomegaly, splenomegaly or mass.     Tenderness: There is no abdominal tenderness.     Hernia: No hernia is present.  Musculoskeletal: Normal range of motion.  Skin:    General: Skin is warm and dry.  Neurological:     Mental Status: She is alert.      UC Treatments / Results  Labs (all labs ordered are listed, but only abnormal results are displayed) Labs Reviewed - No data to display  EKG   Radiology No results found.  Procedures Procedures (including critical care time)  Medications Ordered in UC Medications - No data to display  Initial Impression / Assessment and Plan / UC Course  I have reviewed the triage vital signs and the nursing notes.  Pertinent labs & imaging results that were available during my care of the patient were reviewed by me and considered in my medical decision making (see chart for details).     Patient complains of pain that is been longer than a year.  Multiple medicines have been tried.  2 normal CAT scans.  She is nontoxic and in no  acute distress.  She has absolutely no tenderness or findings on physical examination.  I explained the importance of establishing with a primary care doctor in order to get additional evaluation and testing.  She may need a  specialty evaluation.  She may need endoscopy. Final Clinical Impressions(s) / UC Diagnoses   Final diagnoses:  Abdominal pain, chronic, epigastric     Discharge Instructions     Take the omeprazole 2 x a day morning and night.  This reduces stomach acid  Call tomorrow to set appointment for internal medicine doctor .  You need to see a doctor on an ongoing basis for this problem.  It cannot be thoroughly evaluated at an ER or urgent care.  You may need a specialist.   ED Prescriptions    Medication Sig Dispense Auth. Provider   omeprazole (PRILOSEC) 20 MG capsule Take 1 capsule (20 mg total) by mouth daily. 60 capsule Raylene Everts, MD     Controlled Substance Prescriptions [place] Controlled Substance Registry consulted? Not Applicable   Raylene Everts, MD 12/25/18 1810

## 2020-04-26 ENCOUNTER — Encounter (HOSPITAL_COMMUNITY): Payer: Self-pay

## 2020-04-26 ENCOUNTER — Ambulatory Visit (HOSPITAL_COMMUNITY)
Admission: EM | Admit: 2020-04-26 | Discharge: 2020-04-26 | Disposition: A | Discharge status: Home / Self Care | Payer: BLUE CROSS/BLUE SHIELD | Attending: Family Medicine | Admitting: Family Medicine

## 2020-04-26 ENCOUNTER — Other Ambulatory Visit: Payer: Self-pay

## 2020-04-26 DIAGNOSIS — K645 Perianal venous thrombosis: Secondary | ICD-10-CM

## 2020-04-26 MED ORDER — METRONIDAZOLE 1 % EX GEL: Freq: Two times a day (BID) | CUTANEOUS | 0 refills | Status: DC

## 2020-04-26 MED ORDER — HYDROCORTISONE (PERIANAL) 2.5 % EX CREA: 1.0000 "application " | TOPICAL_CREAM | Freq: Two times a day (BID) | CUTANEOUS | 2 refills | Status: DC

## 2020-04-26 MED ORDER — HYDROCODONE-ACETAMINOPHEN 5-325 MG PO TABS: 1.0000 | ORAL_TABLET | Freq: Three times a day (TID) | ORAL | 0 refills | Status: DC | PRN

## 2020-04-26 NOTE — ED Triage Notes (Signed)
Pt reports having rectal pain x 1 week. States having hemorrhoids, is bleeding x 1 week.  Pt wants to know if she can have surgery, to relief the pain,   " Sometimes I want to cry". OTC cream gives no relief.

## 2020-04-26 NOTE — ED Provider Notes (Signed)
[facility]    CSN: [id number] Arrival date & time: 04/26/20  1317      History   Chief Complaint Chief Complaint  Patient presents with  . Hemorrhoids    HPI Margaret Morales is a 49 y.o. female.   Medical translator used today to help facilitate visit with patient's consent. Patient presenting today with about a week of worsening swollen, painful and bleeding hemorrhoids. States it is painful with each BM and there is some blood when wiping each time but nothing between BMs. Has been trying prep H ointment without benefit. Pain is severe and she is unable to sit on bottom at this point. Denies fever, abdominal pain, fatigue, weakness, N/V/D.      History reviewed. No pertinent past medical history.  There are no problems to display for this patient.   History reviewed. No pertinent surgical history.  OB History   No obstetric history on file.      Home Medications    Prior to Admission medications   Medication Sig Start Date End Date Taking? Authorizing Provider  diphenhydrAMINE (BENADRYL) 25 MG tablet Take 1 tablet (25 mg total) by mouth at bedtime as needed for sleep. 12/01/18   Georgetta Haber, NP  esomeprazole (NEXIUM) 40 MG capsule Take 1 capsule (40 mg total) by mouth daily. 12/01/18   Georgetta Haber, NP  famotidine (PEPCID) 20 MG tablet Take 1 tablet (20 mg total) by mouth at bedtime. 12/01/18   Georgetta Haber, NP  HYDROcodone-acetaminophen (NORCO/VICODIN) 5-325 MG tablet Take 1 tablet by mouth 3 (three) times daily as needed for moderate pain. 04/26/20   Particia Nearing, PA-C  hydrocortisone (ANUSOL-HC) 2.5 % rectal cream Place 1 application rectally 2 (two) times daily. 04/26/20   Particia Nearing, PA-C  metroNIDAZOLE (METROGEL) 1 % gel Apply topically 2 (two) times daily. Apply to anal area twice daily 04/26/20   Particia Nearing, PA-C  Multiple Vitamin (MULTI-VITAMIN DAILY PO) Take 1 tablet by mouth daily.    [provider]   omeprazole (PRILOSEC) 20 MG capsule Take 1 capsule (20 mg total) by mouth daily. 12/25/18   Eustace Moore, MD  dicyclomine (BENTYL) 20 MG tablet Take 1 tablet (20 mg total) by mouth 2 (two) times daily. Patient not taking: Reported on 03/10/2017 01/13/17 12/01/18  Jaynie Crumble, PA-C  omeprazole (PRILOSEC OTC) 20 MG tablet Take 20 mg by mouth 2 (two) times daily.  11/19/18  [provider]  sucralfate (CARAFATE) 1 g tablet Take 1 tablet (1 g total) by mouth 4 (four) times daily -  with meals and at bedtime. 12/25/18 12/25/18  Eustace Moore, MD    Family History Family History  Family history unknown: Yes    Social History Social History   Tobacco Use  . Smoking status: Never Smoker  . Smokeless tobacco: Never Used  Vaping Use  . Vaping Use: Never used  Substance Use Topics  . Alcohol use: No  . Drug use: Not on file     Allergies   Patient has no known allergies.   Review of Systems Review of Systems PER HPI    Physical Exam Triage Vital Signs ED Triage Vitals  Enc Vitals Group     BP 04/26/20 1355 (!) 172/80     Pulse Rate 04/26/20 1355 66     Resp 04/26/20 1355 18     Temp 04/26/20 1355 98.6 F (37 C)     Temp Source 04/26/20 1355 Oral  SpO2 04/26/20 1355 100 %     Weight --      Height --      Head Circumference --      Peak Flow --      Pain Score 04/26/20 1401 9     Pain Loc --      Pain Edu? --      Excl. in GC? --    No data found.  Updated Vital Signs BP (!) 172/80 (BP Location: Left Arm)   Pulse 66   Temp 98.6 F (37 C) (Oral)   Resp 18   LMP 09/13/2016   SpO2 100%   Visual Acuity Right Eye Distance:   Left Eye Distance:   Bilateral Distance:    Right Eye Near:   Left Eye Near:    Bilateral Near:     Physical Exam Vitals and nursing note reviewed.  Constitutional:      Appearance: Normal appearance. She is not ill-appearing.  HENT:     Head: Atraumatic.  Eyes:     Extraocular Movements: Extraocular  movements intact.     Conjunctiva/sclera: Conjunctivae normal.  Cardiovascular:     Rate and Rhythm: Normal rate and regular rhythm.     Heart sounds: Normal heart sounds.  Pulmonary:     Effort: Pulmonary effort is normal.     Breath sounds: Normal breath sounds.  Abdominal:     General: Bowel sounds are normal. There is no distension.     Palpations: Abdomen is soft.     Tenderness: There is no abdominal tenderness. There is no guarding.  Genitourinary:    Comments: Multiple significantly swollen external hemorrhoids present, several becoming thrombosed. Several areas of ulceration/erosin likely from wiping/irritation around anal area as well. No active bleeding, drainage, erythema Musculoskeletal:        General: Normal range of motion.     Cervical back: Normal range of motion and neck supple.  Skin:    General: Skin is warm and dry.  Neurological:     Mental Status: She is alert and oriented to person, place, and time.  Psychiatric:        Mood and Affect: Mood normal.        Thought Content: Thought content normal.        Judgment: Judgment normal.    UC Treatments / Results  Labs (all labs ordered are listed, but only abnormal results are displayed) Labs Reviewed - No data to display  EKG   Radiology No results found.  Procedures Procedures (including critical care time)  Medications Ordered in UC Medications - No data to display  Initial Impression / Assessment and Plan / UC Course  I have reviewed the triage vital signs and the nursing notes.  Pertinent labs & imaging results that were available during my care of the patient were reviewed by me and considered in my medical decision making (see chart for details).     Will rx anusol, flagyl topicals and send norco for prn use for severe pain. Reviewed sitz baths, tucks wipes, avoiding straining or firm wiping. Gen Surgery information given to patient to call Monday morning and get an appt for further  evaluation and mgmt. Return precautions given  Final Clinical Impressions(s) / UC Diagnoses   Final diagnoses:  Thrombosed hemorrhoids     Discharge Instructions     [facility] [address], [place], [place] [address] [phone]    ED Prescriptions    Medication Sig  Dispense Auth. Provider   metroNIDAZOLE (METROGEL) 1 % gel Apply topically 2 (two) times daily. Apply to anal area twice daily 45 g Particia Nearing, PA-C   hydrocortisone (ANUSOL-HC) 2.5 % rectal cream Place 1 application rectally 2 (two) times daily. 60 g Particia Nearing, [place]   HYDROcodone-acetaminophen (NORCO/VICODIN) 5-325 MG tablet Take 1 tablet by mouth 3 (three) times daily as needed for moderate pain. 15 tablet Particia Nearing, [place]     I have reviewed the PDMP during this encounter.   Particia Nearing, [place] 04/26/20 1544

## 2020-04-26 NOTE — Discharge Instructions (Signed)
[facility] [address], [place], [place] [address] [phone] 

## 2020-06-30 ENCOUNTER — Other Ambulatory Visit: Payer: Self-pay

## 2020-06-30 ENCOUNTER — Ambulatory Visit (INDEPENDENT_AMBULATORY_CARE_PROVIDER_SITE_OTHER): Payer: BLUE CROSS/BLUE SHIELD

## 2020-06-30 DIAGNOSIS — Z23 Encounter for immunization: Secondary | ICD-10-CM

## 2020-06-30 NOTE — Progress Notes (Signed)
   Covid-19 Vaccination Clinic  Name:  Margaret Morales    MRN: [id number] DOB: 1971/02/14  06/30/2020  Margaret Morales was observed post Covid-19 immunization for 15 minutes without incident. She was provided with Vaccine Information Sheet and instruction to access the V-Safe system.   Margaret Morales was instructed to call 911 with any severe reactions post vaccine: Marland Kitchen Difficulty breathing  . Swelling of face and throat  . A fast heartbeat  . A bad rash all over body  . Dizziness and weakness   Immunizations Administered    Name Date Dose VIS Date Route   Pfizer COVID-19 Vaccine 06/30/2020 11:16 AM 0.3 mL 03/26/2020 Intramuscular   Manufacturer: [employer], [employer]   Lot: [id number]   NDC: [address]-8

## 2020-09-29 ENCOUNTER — Ambulatory Visit (HOSPITAL_COMMUNITY)
Admission: EM | Admit: 2020-09-29 | Discharge: 2020-09-29 | Disposition: A | Discharge status: Home / Self Care | Payer: BLUE CROSS/BLUE SHIELD | Attending: Urgent Care | Admitting: Urgent Care

## 2020-09-29 ENCOUNTER — Other Ambulatory Visit: Payer: Self-pay

## 2020-09-29 DIAGNOSIS — R109 Unspecified abdominal pain: Secondary | ICD-10-CM

## 2020-09-29 DIAGNOSIS — G8929 Other chronic pain: Secondary | ICD-10-CM | POA: Diagnosis not present

## 2020-09-29 LAB — POCT URINALYSIS DIPSTICK, ED / UC
Bilirubin Urine: NEGATIVE
Glucose, UA: NEGATIVE mg/dL
Ketones, ur: NEGATIVE mg/dL
Leukocytes,Ua: NEGATIVE
Nitrite: NEGATIVE
Protein, ur: NEGATIVE mg/dL
Specific Gravity, Urine: 1.01 (ref 1.005–1.030)
Urobilinogen, UA: 0.2 mg/dL (ref 0.0–1.0)
pH: 5.5 (ref 5.0–8.0)

## 2020-09-29 LAB — CBG MONITORING, ED: Glucose-Capillary: 86 mg/dL (ref 70–99)

## 2020-09-29 MED ORDER — NAPROXEN 375 MG PO TABS: 375.0000 mg | ORAL_TABLET | Freq: Two times a day (BID) | ORAL | 0 refills | Status: DC

## 2020-09-29 NOTE — ED Provider Notes (Signed)
[facility]   MRN: [id number] DOB: Jul 18, 1970  Subjective:   Margaret Morales is a 50 y.o. female presenting for evaluation for her chronic abdominal pain.  Patient has had persistent intermittent lower abdominal pain for the past 4 to 5 years.  Denies fever, chest pain, shortness of breath, nausea, vomiting, diarrhea, constipation, dysuria, urinary frequency.  Denies bloody stools.  Does not use NSAIDs regularly.  Patient does not have a regular doctor.  Has never been evaluated by a gastroenterologist.  She did have a CT scan of the abdomen pelvis in 2018 for similar abdominal pain.  Denies taking chronic medications.    No Known Allergies  No past medical history on file.   No past surgical history on file.  Family History  Family history unknown: Yes    Social History   Tobacco Use  . Smoking status: Never Smoker  . Smokeless tobacco: Never Used  Vaping Use  . Vaping Use: Never used  Substance Use Topics  . Alcohol use: No    ROS   Objective:   Vitals: BP (!) 168/88 (BP Location: Right Arm)   Pulse 86   Temp 98.5 F (36.9 C) (Oral)   Resp 18   LMP 09/13/2016   SpO2 97%   Physical Exam Constitutional:      General: She is not in acute distress.    Appearance: Normal appearance. She is well-developed and normal weight. She is not ill-appearing, toxic-appearing or diaphoretic.  HENT:     Head: Normocephalic and atraumatic.     Right Ear: External ear normal.     Left Ear: External ear normal.     Nose: Nose normal.     Mouth/Throat:     Mouth: Mucous membranes are moist.     Pharynx: Oropharynx is clear.  Eyes:     General: No scleral icterus.       Right eye: No discharge.        Left eye: No discharge.     Extraocular Movements: Extraocular movements intact.     Conjunctiva/sclera: Conjunctivae normal.     Pupils: Pupils are equal, round, and reactive to light.  Cardiovascular:     Rate and Rhythm: Normal rate and regular rhythm.      Heart sounds: Normal heart sounds. No murmur heard. No friction rub. No gallop.   Pulmonary:     Effort: Pulmonary effort is normal. No respiratory distress.     Breath sounds: Normal breath sounds. No stridor. No wheezing, rhonchi or rales.  Abdominal:     General: Bowel sounds are normal. There is no distension.     Palpations: Abdomen is soft. There is no mass.     Tenderness: There is abdominal tenderness in the right lower quadrant and left lower quadrant. There is no right CVA tenderness, left CVA tenderness, guarding or rebound.  Skin:    General: Skin is warm and dry.     Coloration: Skin is not pale.     Findings: No rash.  Neurological:     General: No focal deficit present.     Mental Status: She is alert and oriented to person, place, and time.  Psychiatric:        Mood and Affect: Mood normal.        Behavior: Behavior normal.        Thought Content: Thought content normal.        Judgment: Judgment normal.     Results for orders placed  or performed during the hospital encounter of 09/29/20 (from the past 24 hour(s))  POC Urinalysis dipstick     Status: Abnormal   Collection Time: 09/29/20  5:27 PM  Result Value Ref Range   Glucose, UA NEGATIVE NEGATIVE mg/dL   Bilirubin Urine NEGATIVE NEGATIVE   Ketones, ur NEGATIVE NEGATIVE mg/dL   Specific Gravity, Urine 1.010 1.005 - 1.030   Hgb urine dipstick SMALL (A) NEGATIVE   pH 5.5 5.0 - 8.0   Protein, ur NEGATIVE NEGATIVE mg/dL   Urobilinogen, UA 0.2 0.0 - 1.0 mg/dL   Nitrite NEGATIVE NEGATIVE   Leukocytes,Ua NEGATIVE NEGATIVE  POC CBG monitoring     Status: None   Collection Time: 09/29/20  5:32 PM  Result Value Ref Range   Glucose-Capillary 86 70 - 99 mg/dL    Assessment and Plan :   PDMP not reviewed this encounter.  1. Chronic abdominal pain     Recommended patient use naproxen twice daily with food for chronic abdominal pain.  I also encouraged her to establish care with a [facility]necologist [facility] for a complete pelvic exam.  Given the chronic nature of her abdominal pain I do not suspect an acute emergency and therefore did not recommend that she go to the emergency room now for repeat CT scan. Counseled patient on potential for adverse effects with medications prescribed/recommended today, ER and return-to-clinic precautions discussed, patient verbalized understanding.    Wallis Bamberg, [place] 09/29/20 1756

## 2020-09-29 NOTE — Discharge Instructions (Signed)
??????????? ?????????????????????????? ???????????? ?????????????? ????????? ??????????? ??????????????? ????????????????? ???????????????????????????????????????   naproxen ??????????????????????????????? kyaayyjuupyu  aamyoesamee kyannmarrayy htarnako hponesaatpyee  tain parr sonetwin hcaitsayymhu aapyeesaatraan  raathkyane payypar .  taithkyanetaee twin ,  sainsai saineat narkyinmhu aatwat aahcarraahcar nhangaatuu naproxen  ko naehcain nhaitkyaain sone ninesai .

## 2020-09-29 NOTE — ED Triage Notes (Addendum)
Pt reports intermittent left lower quadrant pain x 3 years. Pain is worse when she is hungry, improve when eating.   Pt wants her "uterus" , blood sugarand blood pressure to be check as she do not have a ride and is hard for her to go to the Doctor.

## 2021-04-27 IMAGING — DX CHEST - 2 VIEW
2 series · 2 of 2 positions shown · non-contrast
Comparison: 02/09/2008.

CLINICAL DATA: Chest pain.

EXAM:
CHEST - 2 VIEW

[chest pa]
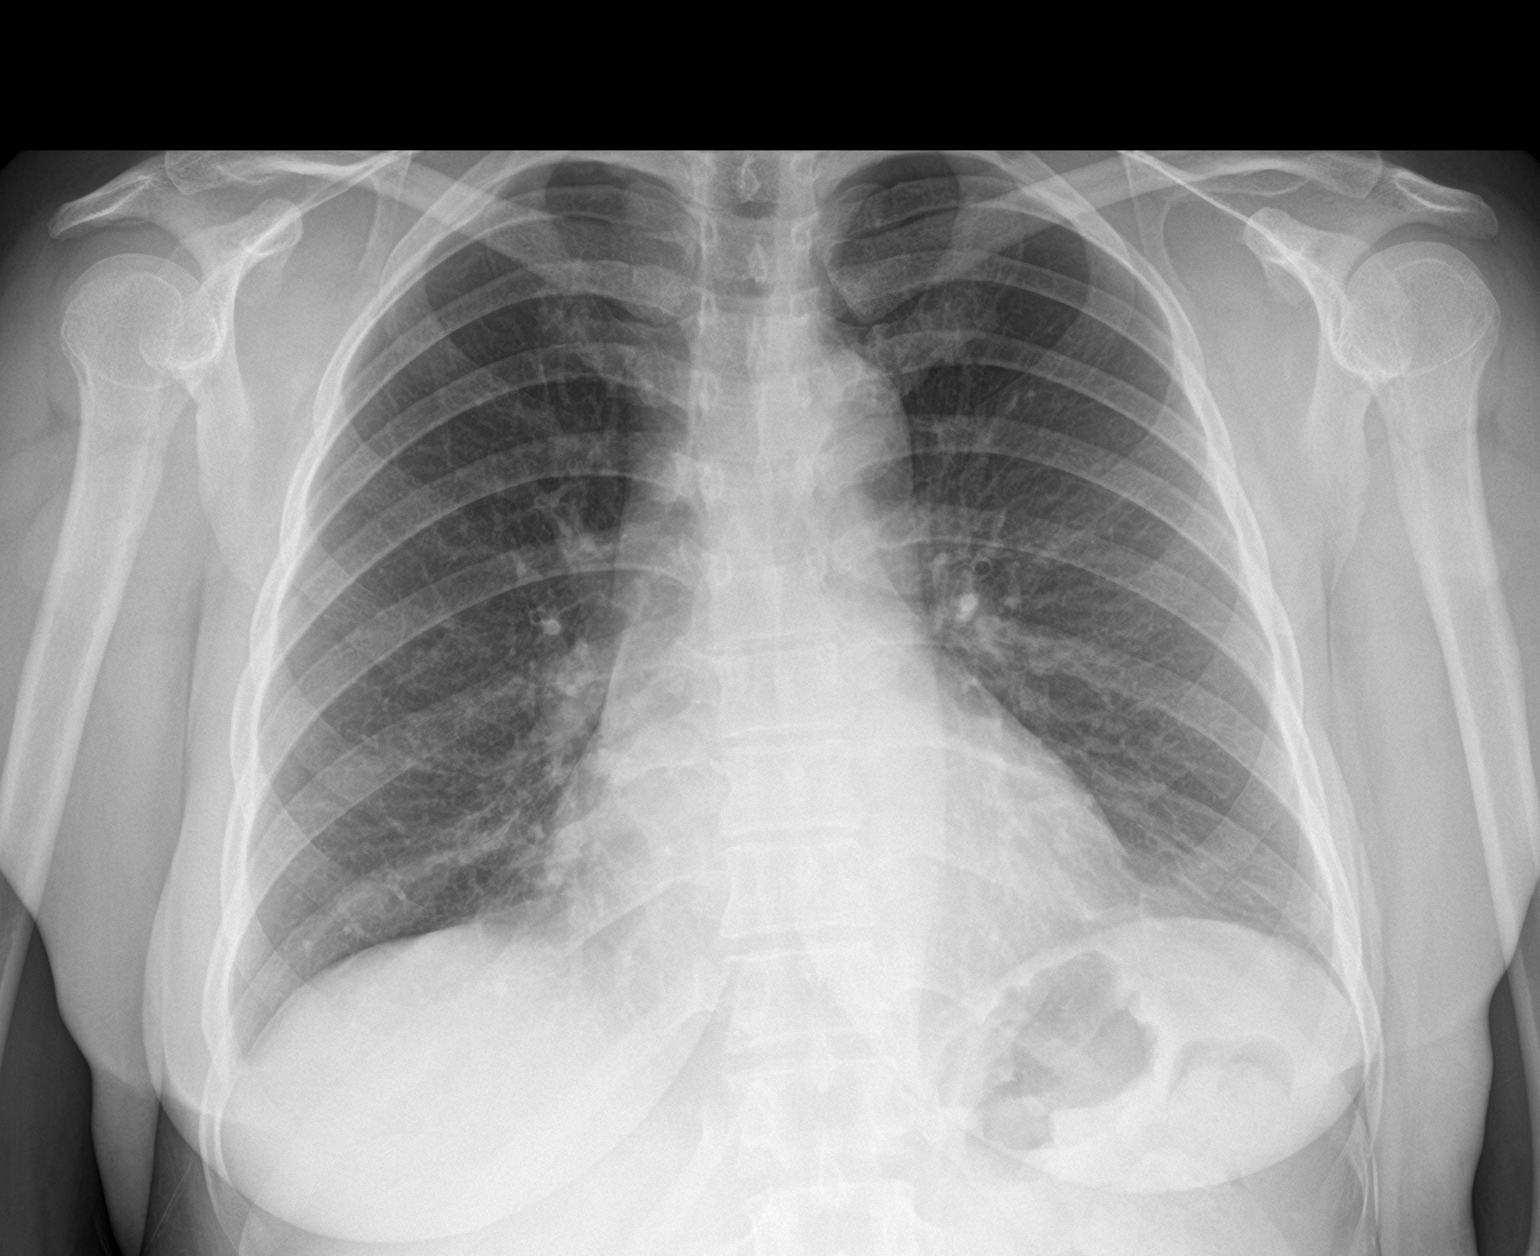

[chest lat]
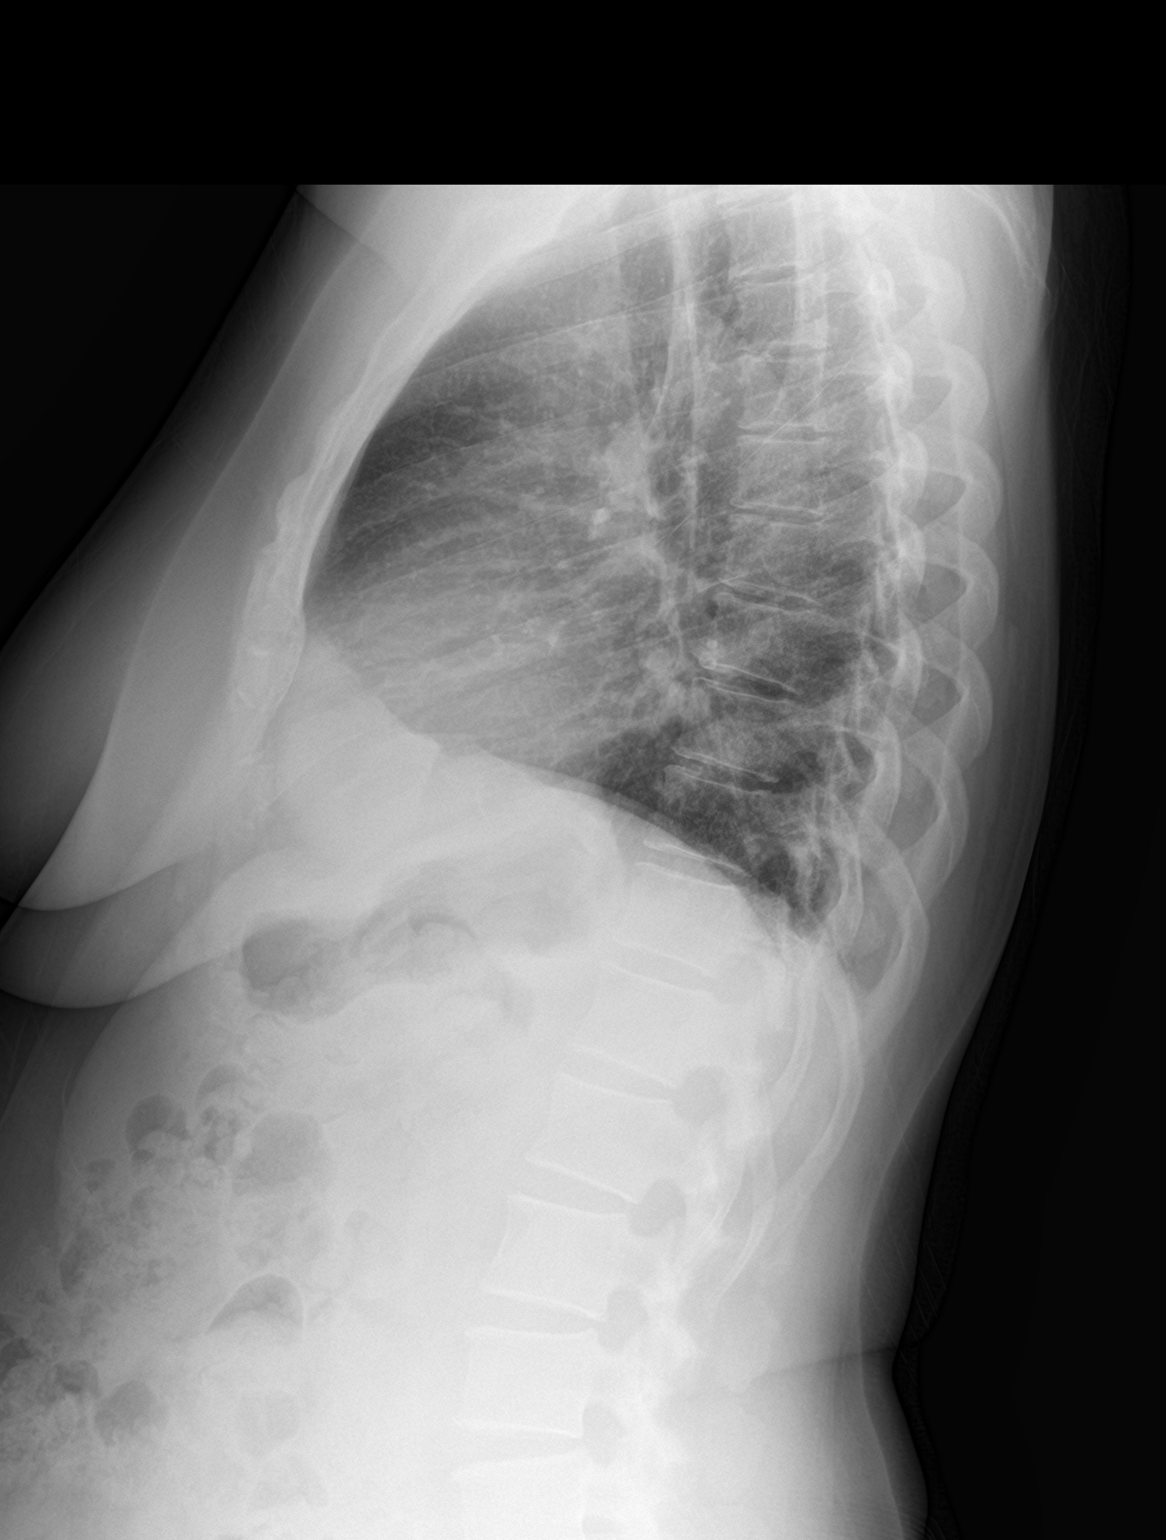

[2 of 2 positions shown; findings below may reference images not displayed]

FINDINGS: Mediastinum and hilar structures normal. Low lung volumes with mild
bibasilar atelectasis. No pleural effusion or pneumothorax. Heart
size normal. No acute bony abnormality.
IMPRESSION: No acute cardiopulmonary disease.

## 2021-05-12 ENCOUNTER — Other Ambulatory Visit: Payer: Self-pay

## 2021-05-12 ENCOUNTER — Emergency Department (HOSPITAL_COMMUNITY)
Admission: EM | Admit: 2021-05-12 | Discharge: 2021-05-13 | Disposition: A | Discharge status: Home / Self Care | Payer: BLUE CROSS/BLUE SHIELD | Attending: Emergency Medicine | Admitting: Emergency Medicine

## 2021-05-12 ENCOUNTER — Encounter (HOSPITAL_COMMUNITY): Payer: Self-pay

## 2021-05-12 DIAGNOSIS — R531 Weakness: Secondary | ICD-10-CM | POA: Insufficient documentation

## 2021-05-12 DIAGNOSIS — R197 Diarrhea, unspecified: Secondary | ICD-10-CM | POA: Diagnosis not present

## 2021-05-12 DIAGNOSIS — R42 Dizziness and giddiness: Secondary | ICD-10-CM | POA: Diagnosis not present

## 2021-05-12 DIAGNOSIS — R112 Nausea with vomiting, unspecified: Secondary | ICD-10-CM | POA: Diagnosis present

## 2021-05-12 DIAGNOSIS — Z5321 Procedure and treatment not carried out due to patient leaving prior to being seen by health care provider: Secondary | ICD-10-CM | POA: Diagnosis not present

## 2021-05-12 LAB — CBC WITH DIFFERENTIAL/PLATELET
Abs Immature Granulocytes: 0.04 10*3/uL (ref 0.00–0.07)
Basophils Absolute: 0 10*3/uL (ref 0.0–0.1)
Basophils Relative: 0 %
Eosinophils Absolute: 0.1 10*3/uL (ref 0.0–0.5)
Eosinophils Relative: 1 %
HCT: 33.9 % — ABNORMAL LOW (ref 36.0–46.0)
Hemoglobin: 10.8 g/dL — ABNORMAL LOW (ref 12.0–15.0)
Immature Granulocytes: 0 %
Lymphocytes Relative: 17 %
Lymphs Abs: 1.7 10*3/uL (ref 0.7–4.0)
MCH: 27 pg (ref 26.0–34.0)
MCHC: 31.9 g/dL (ref 30.0–36.0)
MCV: 84.8 fL (ref 80.0–100.0)
Monocytes Absolute: 0.5 10*3/uL (ref 0.1–1.0)
Monocytes Relative: 5 %
Neutro Abs: 7.5 10*3/uL (ref 1.7–7.7)
Neutrophils Relative %: 77 %
Platelets: 279 10*3/uL (ref 150–400)
RBC: 4 MIL/uL (ref 3.87–5.11)
RDW: 16.1 % — ABNORMAL HIGH (ref 11.5–15.5)
WBC: 9.8 10*3/uL (ref 4.0–10.5)
nRBC: 0 % (ref 0.0–0.2)

## 2021-05-12 MED ORDER — ONDANSETRON 4 MG PO TBDP
4.0000 mg | ORAL_TABLET | Freq: Once | ORAL | Status: AC
  Administered 2021-05-12: 4 mg via ORAL
  Filled 2021-05-12: qty 1

## 2021-05-12 NOTE — ED Triage Notes (Signed)
Pt arrives POV for eval of nausea and chills after taking bowel prep for a colonoscopy tomorrow. Per note pt received golytely  and mag citrate for prep, drank both at 1600, started w/ nausea, dizziness, chills at 2000. Pt denies abd pain or cramping. Grandson at bedside in triage to assist with translation

## 2021-05-12 NOTE — ED Provider Notes (Signed)
Emergency Medicine Provider Triage Evaluation Note  Margaret Morales , a 50 y.o. female  was evaluated in triage.  Pt complains of nausea, vomiting, diarrhea.  Saw GI physician earlier today, around 4PM today started bowel prep for endoscopy/colonoscopy tomorrow at [facility].  Now she feels weak and dizzy.  Denies any pain at present, just feels very unwell and wants it to stop.  Grandson at bedside helping to translate, patient speaks burmese.  Review of Systems  Positive: N/V/D Negative: fever  Physical Exam  BP (!) 160/94 (BP Location: Right Arm)   Pulse 79   Temp 97.7 F (36.5 C) (Oral)   Resp (!) 24   LMP 09/13/2016   SpO2 100%   Gen:   Awake, moaning throughout exam Resp:  Normal effort MSK:   Moves extremities without difficulty  Other:    Medical Decision Making  Medically screening exam initiated at 10:48 PM.  Appropriate orders placed.  Amala Puskarich was informed that the remainder of the evaluation will be completed by another provider, this initial triage assessment does not replace that evaluation, and the importance of remaining in the ED until their evaluation is complete.  N/V/D after starting bowel prep this afternoon for endoscopy/colonoscopy tomorrow.  VSS.  Offered zofran in triage-- patient cannot decide if she wants to take this or not.   Garlon Hatchet, PA-C 05/12/21 2253    Wynetta Fines, MD 05/13/21 2329

## 2021-05-13 LAB — LIPASE, BLOOD: Lipase: 22 U/L (ref 11–51)

## 2021-05-13 LAB — COMPREHENSIVE METABOLIC PANEL
ALT: 13 U/L (ref 0–44)
AST: 24 U/L (ref 15–41)
Albumin: 3.8 g/dL (ref 3.5–5.0)
Alkaline Phosphatase: 70 U/L (ref 38–126)
Anion gap: 13 (ref 5–15)
BUN: 8 mg/dL (ref 6–20)
CO2: 19 mmol/L — ABNORMAL LOW (ref 22–32)
Calcium: 9 mg/dL (ref 8.9–10.3)
Chloride: 91 mmol/L — ABNORMAL LOW (ref 98–111)
Creatinine, Ser: 0.67 mg/dL (ref 0.44–1.00)
GFR, Estimated: >60 mL/min (ref >60)
Glucose, Bld: 140 mg/dL — ABNORMAL HIGH (ref 70–99)
Potassium: 3 mmol/L — ABNORMAL LOW (ref 3.5–5.1)
Sodium: 123 mmol/L — ABNORMAL LOW (ref 135–145)
Total Bilirubin: 0.7 mg/dL (ref 0.3–1.2)
Total Protein: 7.5 g/dL (ref 6.5–8.1)

## 2021-05-13 NOTE — ED Notes (Signed)
Pt decided to leave while waiting for a room.  

## 2021-07-22 ENCOUNTER — Emergency Department (HOSPITAL_COMMUNITY): Payer: BLUE CROSS/BLUE SHIELD

## 2021-07-22 ENCOUNTER — Emergency Department (HOSPITAL_COMMUNITY)
Admission: EM | Admit: 2021-07-22 | Discharge: 2021-07-22 | Disposition: A | Discharge status: Home / Self Care | Payer: BLUE CROSS/BLUE SHIELD | Attending: Physician Assistant | Admitting: Physician Assistant

## 2021-07-22 ENCOUNTER — Other Ambulatory Visit: Payer: Self-pay

## 2021-07-22 DIAGNOSIS — R5383 Other fatigue: Secondary | ICD-10-CM | POA: Diagnosis not present

## 2021-07-22 DIAGNOSIS — R42 Dizziness and giddiness: Secondary | ICD-10-CM | POA: Diagnosis not present

## 2021-07-22 DIAGNOSIS — R197 Diarrhea, unspecified: Secondary | ICD-10-CM | POA: Insufficient documentation

## 2021-07-22 DIAGNOSIS — Z5321 Procedure and treatment not carried out due to patient leaving prior to being seen by health care provider: Secondary | ICD-10-CM | POA: Diagnosis not present

## 2021-07-22 DIAGNOSIS — K625 Hemorrhage of anus and rectum: Secondary | ICD-10-CM | POA: Insufficient documentation

## 2021-07-22 DIAGNOSIS — R1032 Left lower quadrant pain: Secondary | ICD-10-CM | POA: Diagnosis not present

## 2021-07-22 LAB — COMPREHENSIVE METABOLIC PANEL
ALT: 14 U/L (ref 0–44)
AST: 21 U/L (ref 15–41)
Albumin: 4 g/dL (ref 3.5–5.0)
Alkaline Phosphatase: 61 U/L (ref 38–126)
Anion gap: 9 (ref 5–15)
BUN: 8 mg/dL (ref 6–20)
CO2: 24 mmol/L (ref 22–32)
Calcium: 9.6 mg/dL (ref 8.9–10.3)
Chloride: 110 mmol/L (ref 98–111)
Creatinine, Ser: 0.75 mg/dL (ref 0.44–1.00)
GFR, Estimated: >60 mL/min (ref >60)
Glucose, Bld: 108 mg/dL — ABNORMAL HIGH (ref 70–99)
Potassium: 4.1 mmol/L (ref 3.5–5.1)
Sodium: 143 mmol/L (ref 135–145)
Total Bilirubin: 0.3 mg/dL (ref 0.3–1.2)
Total Protein: 7.9 g/dL (ref 6.5–8.1)

## 2021-07-22 LAB — CBC WITH DIFFERENTIAL/PLATELET
Abs Immature Granulocytes: 0.01 10*3/uL (ref 0.00–0.07)
Basophils Absolute: 0 10*3/uL (ref 0.0–0.1)
Basophils Relative: 1 %
Eosinophils Absolute: 0.1 10*3/uL (ref 0.0–0.5)
Eosinophils Relative: 1 %
HCT: 25.8 % — ABNORMAL LOW (ref 36.0–46.0)
Hemoglobin: 7.6 g/dL — ABNORMAL LOW (ref 12.0–15.0)
Immature Granulocytes: 0 %
Lymphocytes Relative: 31 %
Lymphs Abs: 1.8 10*3/uL (ref 0.7–4.0)
MCH: 25.5 pg — ABNORMAL LOW (ref 26.0–34.0)
MCHC: 29.5 g/dL — ABNORMAL LOW (ref 30.0–36.0)
MCV: 86.6 fL (ref 80.0–100.0)
Monocytes Absolute: 0.4 10*3/uL (ref 0.1–1.0)
Monocytes Relative: 6 %
Neutro Abs: 3.7 10*3/uL (ref 1.7–7.7)
Neutrophils Relative %: 61 %
Platelets: 326 10*3/uL (ref 150–400)
RBC: 2.98 MIL/uL — ABNORMAL LOW (ref 3.87–5.11)
RDW: 14.6 % (ref 11.5–15.5)
WBC: 6 10*3/uL (ref 4.0–10.5)
nRBC: 0 % (ref 0.0–0.2)

## 2021-07-22 LAB — TYPE AND SCREEN
ABO/RH(D): O POS
Antibody Screen: NEGATIVE

## 2021-07-22 LAB — LIPASE, BLOOD: Lipase: 29 U/L (ref 11–51)

## 2021-07-22 MED ORDER — IOHEXOL 350 MG/ML SOLN
100.0000 mL | Freq: Once | INTRAVENOUS | Status: AC | PRN
  Administered 2021-07-22: 100 mL via INTRAVENOUS

## 2021-07-22 NOTE — ED Triage Notes (Signed)
Interpreter utilized during Triage:   Reported was sent by OP GI d/t abnormal lab; hx of rectal bleeding; patient reported she had some bloody stools.  C/O abdominal pain as well.

## 2021-07-22 NOTE — ED Notes (Signed)
Pt called X2 for vitals with no response, but then came to tech Jackelyn Hoehn and stated they were leaving

## 2021-07-22 NOTE — ED Provider Triage Note (Signed)
Emergency Medicine Provider Triage Evaluation Note  Margaret Morales , a 51 y.o. female  was evaluated in triage.  Pt complains of abdominal pain, diarrhea, bright red rectal bleeding.  She is having rectal bleeding with every bowel movement, worse over the last 2 days.  States she had large bowel movement prior to arrival with clots.  She feels fatigued, lightheaded.  No prior colonoscopy.  She has pain to her left lower abdomen.  No history of diverticulitis.  Of note she was recently diagnosed with H pylori, intermittently compliant with medications.  She was seen by GI today.  Had labs drawn, sent here due to abnormal labs  Review of Systems  Positive: Left lower abdominal pain, diarrhea, rectal bleeding, weakness Negative:   Physical Exam  BP (!) 170/111    Pulse 99    Temp 98.3 F (36.8 C) (Oral)    Resp 17    Ht 5' (1.524 m)    Wt 71.7 kg    LMP 09/13/2016    SpO2 100%    BMI 30.86 kg/m  Gen:   Awake, no distress   Resp:  Normal effort  MSK:   Moves extremities without difficulty  Other:    Medical Decision Making  Medically screening exam initiated at 5:52 PM.  Appropriate orders placed.  Margaret Morales was informed that the remainder of the evaluation will be completed by another provider, this initial triage assessment does not replace that evaluation, and the importance of remaining in the ED until their evaluation is complete.  Abd pain, rectal bleeding  Labs reviewed hgb 7.6 at GI down from 10.8 2 months ago   Elisandra Deshmukh A, PA-C 07/22/21 1755

## 2021-07-23 ENCOUNTER — Observation Stay (HOSPITAL_COMMUNITY)
Admission: EM | Admit: 2021-07-23 | Discharge: 2021-07-24 | Disposition: A | Discharge status: Home / Self Care | Payer: BLUE CROSS/BLUE SHIELD | Attending: Internal Medicine | Admitting: Internal Medicine

## 2021-07-23 ENCOUNTER — Encounter (HOSPITAL_COMMUNITY): Payer: Self-pay | Admitting: Emergency Medicine

## 2021-07-23 DIAGNOSIS — K922 Gastrointestinal hemorrhage, unspecified: Principal | ICD-10-CM | POA: Diagnosis present

## 2021-07-23 DIAGNOSIS — A048 Other specified bacterial intestinal infections: Secondary | ICD-10-CM | POA: Diagnosis present

## 2021-07-23 DIAGNOSIS — Z20822 Contact with and (suspected) exposure to covid-19: Secondary | ICD-10-CM | POA: Diagnosis not present

## 2021-07-23 DIAGNOSIS — D62 Acute posthemorrhagic anemia: Secondary | ICD-10-CM | POA: Insufficient documentation

## 2021-07-23 DIAGNOSIS — Z79899 Other long term (current) drug therapy: Secondary | ICD-10-CM | POA: Diagnosis not present

## 2021-07-23 DIAGNOSIS — K649 Unspecified hemorrhoids: Secondary | ICD-10-CM

## 2021-07-23 DIAGNOSIS — D649 Anemia, unspecified: Secondary | ICD-10-CM

## 2021-07-23 LAB — FOLATE: Folate: 17.2 ng/mL (ref >5.9)

## 2021-07-23 LAB — RESP PANEL BY RT-PCR (FLU A&B, COVID) ARPGX2
Influenza A by PCR: NEGATIVE
Influenza B by PCR: NEGATIVE
SARS Coronavirus 2 by RT PCR: NEGATIVE

## 2021-07-23 LAB — COMPREHENSIVE METABOLIC PANEL
ALT: 12 U/L (ref 0–44)
AST: 19 U/L (ref 15–41)
Albumin: 3.8 g/dL (ref 3.5–5.0)
Alkaline Phosphatase: 68 U/L (ref 38–126)
Anion gap: 8 (ref 5–15)
BUN: 7 mg/dL (ref 6–20)
CO2: 24 mmol/L (ref 22–32)
Calcium: 9.2 mg/dL (ref 8.9–10.3)
Chloride: 107 mmol/L (ref 98–111)
Creatinine, Ser: 0.69 mg/dL (ref 0.44–1.00)
GFR, Estimated: >60 mL/min (ref >60)
Glucose, Bld: 94 mg/dL (ref 70–99)
Potassium: 4.4 mmol/L (ref 3.5–5.1)
Sodium: 139 mmol/L (ref 135–145)
Total Bilirubin: 0.3 mg/dL (ref 0.3–1.2)
Total Protein: 7.4 g/dL (ref 6.5–8.1)

## 2021-07-23 LAB — POC OCCULT BLOOD, ED: Fecal Occult Bld: POSITIVE — AB

## 2021-07-23 LAB — IRON AND TIBC
Iron: 16 ug/dL — ABNORMAL LOW (ref 28–170)
Saturation Ratios: 4 % — ABNORMAL LOW (ref 10.4–31.8)
TIBC: 449 ug/dL (ref 250–450)
UIBC: 433 ug/dL

## 2021-07-23 LAB — CBC
HCT: 24.8 % — ABNORMAL LOW (ref 36.0–46.0)
Hemoglobin: 7.5 g/dL — ABNORMAL LOW (ref 12.0–15.0)
MCH: 25.8 pg — ABNORMAL LOW (ref 26.0–34.0)
MCHC: 30.2 g/dL (ref 30.0–36.0)
MCV: 85.2 fL (ref 80.0–100.0)
Platelets: 310 10*3/uL (ref 150–400)
RBC: 2.91 MIL/uL — ABNORMAL LOW (ref 3.87–5.11)
RDW: 14.6 % (ref 11.5–15.5)
WBC: 4.4 10*3/uL (ref 4.0–10.5)
nRBC: 0 % (ref 0.0–0.2)

## 2021-07-23 LAB — RETICULOCYTES
Immature Retic Fract: 22.6 % — ABNORMAL HIGH (ref 2.3–15.9)
RBC.: 2.61 MIL/uL — ABNORMAL LOW (ref 3.87–5.11)
Retic Count, Absolute: 62.7 10*3/uL (ref 19.0–186.0)
Retic Ct Pct: 2.4 % (ref 0.4–3.1)

## 2021-07-23 LAB — VITAMIN B12: Vitamin B-12: 522 pg/mL (ref 180–914)

## 2021-07-23 LAB — FERRITIN: Ferritin: 3 ng/mL — ABNORMAL LOW (ref 11–307)

## 2021-07-23 LAB — PREPARE RBC (CROSSMATCH)

## 2021-07-23 MED ORDER — SODIUM CHLORIDE 0.9 % IV SOLN
10.0000 mL/h | Freq: Once | INTRAVENOUS | Status: AC
  Administered 2021-07-24: 10 mL/h via INTRAVENOUS

## 2021-07-23 NOTE — H&P (Signed)
History and Physical    Margaret Morales DOB: November 19, 1970 DOA: 07/23/2021  PCP: Gustavus Bryant, PA-C  Patient coming from: Home  HPI: Margaret Morales is a 51 y.o. female with medical history significant of H. pylori, GERD, dyspepsia, hemorrhoids.  Patient is seen by gastroenterology at [facility].  She had colonoscopy and EGD done in December 2022.  EGD revealed mild erythema in gastric antrum with biopsy showing H. pylori.  She was started on treatment for H. pylori with quadruple therapy.  Colonoscopy revealed 5 mm rectal HPP, medium internal hemorrhoids.  Patient had a follow-up visit with GI yesterday and reported ongoing rectal bleeding since after her colonoscopy which was felt to be due to hemorrhoids and constipation.  Patient was started on Anusol suppositories, daily fiber supplement, MiraLAX, and sitz bath's.  Since patient was not able to tolerate quadruple therapy for H. pylori due to nausea and vomiting, she was switched to triple therapy.  Labs were drawn during this visit which revealed low hemoglobin of 7.6 and patient was sent to the ED for blood transfusion due to concern for symptomatic anemia. Hemoglobin was 10.8 on 05/12/2021.  In the ED, vital signs stable.  Labs revealed hemoglobin 7.5.  Reddish-brown stool noted on rectal exam and FOBT positive.  Anemia panel showing low iron and low ferritin.  CTA abdomen pelvis negative for active bleeding. 1 unit PRBCs ordered.  [facility] (Dr. Rush Landmark) consulted via secure chat.  Patient speaks Burmese and interpreter services used.  She endorses ongoing rectal bleeding (bright red blood per rectum) since after her colonoscopy in December.  States it happens every times she has a bowel movement.  Also endorsing left lower quadrant abdominal pain.  Denies any rectal pain or constipation.  She has not vomited blood.  She does not take any blood thinners.  Reports fatigue and occasional dizziness.  Denies chest pain or shortness of  breath.  Review of Systems:  All systems reviewed and apart from history of presenting illness, are negative.  Past medical history: See HPI.  No past surgical history on file.   reports that she has never smoked. She has never used smokeless tobacco. She reports that she does not drink alcohol. No history on file for drug use.  No Known Allergies  Family History  Family history unknown: Yes    Prior to Admission medications   Medication Sig Start Date End Date Taking? Authorizing Provider  amoxicillin (AMOXIL) 500 MG tablet Take 1,000 mg by mouth See admin instructions. Bid x 14 days 07/22/21  Yes [provider]  clarithromycin (BIAXIN) 500 MG tablet Take 500 mg by mouth See admin instructions. Bid x 14 days 07/22/21  Yes [provider]  Omega-3 Fatty Acids (FISH OIL PO) Take 1 capsule by mouth every other day.   Yes [provider]  bismuth subsalicylate (PEPTO BISMOL) 262 MG chewable tablet Chew 524 mg by mouth See admin instructions. Qid x 14 days 07/22/21 08/05/21  [provider]  hydrocortisone (ANUSOL-HC) 25 MG suppository Place 25 suppositories rectally See admin instructions. Bid x 6 days 07/22/21 07/28/21  [provider]  naproxen (NAPROSYN) 375 MG tablet Take 1 tablet (375 mg total) by mouth 2 (two) times daily with a meal. Patient not taking: Reported on 07/23/2021 09/29/20   Jaynee Eagles, PA-C  omeprazole (PRILOSEC) 20 MG capsule Take 20 mg by mouth See admin instructions. Bid x 14 days 07/22/21 08/05/21  [provider]  dicyclomine (BENTYL) 20 MG  tablet Take 1 tablet (20 mg total) by mouth 2 (two) times daily. Patient not taking: Reported on 03/10/2017 01/13/17 12/01/18  Jeannett Senior, PA-C  diphenhydrAMINE (BENADRYL) 25 MG tablet Take 1 tablet (25 mg total) by mouth at bedtime as needed for sleep. 12/01/18 09/29/20  Zigmund Gottron, NP  esomeprazole (NEXIUM) 40 MG capsule Take 1 capsule (40 mg total) by mouth daily. 12/01/18  09/29/20  Zigmund Gottron, NP  famotidine (PEPCID) 20 MG tablet Take 1 tablet (20 mg total) by mouth at bedtime. 12/01/18 09/29/20  Zigmund Gottron, NP  omeprazole (PRILOSEC OTC) 20 MG tablet Take 20 mg by mouth 2 (two) times daily.  11/19/18  [provider]  sucralfate (CARAFATE) 1 g tablet Take 1 tablet (1 g total) by mouth 4 (four) times daily -  with meals and at bedtime. 12/25/18 12/25/18  Raylene Everts, MD    Physical Exam: Vitals:   07/23/21 2315 07/23/21 2330 07/23/21 2345 07/24/21 0000  BP: 123/73 119/60 102/60 (!) 112/56  Pulse: 78 71 66 75  Resp: 18 15 15 17   Temp:      TempSrc:      SpO2: 99% 97% 99% 99%    Physical Exam Constitutional:      General: She is not in acute distress. HENT:     Head: Normocephalic and atraumatic.     Mouth/Throat:     Mouth: Mucous membranes are moist.  Eyes:     Extraocular Movements: Extraocular movements intact.     Conjunctiva/sclera: Conjunctivae normal.  Cardiovascular:     Rate and Rhythm: Normal rate and regular rhythm.     Pulses: Normal pulses.  Pulmonary:     Effort: Pulmonary effort is normal. No respiratory distress.     Breath sounds: Normal breath sounds. No wheezing or rales.  Abdominal:     General: Bowel sounds are normal. There is no distension.     Palpations: Abdomen is soft.     Tenderness: There is no abdominal tenderness. There is no guarding or rebound.  Musculoskeletal:        General: No swelling or tenderness.     Cervical back: Normal range of motion and neck supple.  Skin:    General: Skin is warm and dry.  Neurological:     General: No focal deficit present.     Mental Status: She is alert and oriented to person, place, and time.     Labs on Admission: I have personally reviewed following labs and imaging studies  CBC: Recent Labs  Lab 07/22/21 1755 07/23/21 1342  WBC 6.0 4.4  NEUTROABS 3.7  --   HGB 7.6* 7.5*  HCT 25.8* 24.8*  MCV 86.6 85.2  PLT 326 [id number]   Basic Metabolic  Panel: Recent Labs  Lab 07/22/21 1755 07/23/21 1342  NA 143 139  K 4.1 4.4  CL 110 107  CO2 24 24  GLUCOSE 108* 94  BUN 8 7  CREATININE 0.75 0.69  CALCIUM 9.6 9.2   GFR: Estimated Creatinine Clearance: 75.2 mL/min (by C-G formula based on SCr of 0.69 mg/dL). Liver Function Tests: Recent Labs  Lab 07/22/21 1755 07/23/21 1342  AST 21 19  ALT 14 12  ALKPHOS 61 68  BILITOT 0.3 0.3  PROT 7.9 7.4  ALBUMIN 4.0 3.8   Recent Labs  Lab 07/22/21 1755  LIPASE 29   No results for input(s): AMMONIA in the last 168 hours. Coagulation Profile: No results for input(s): INR, PROTIME in the last  168 hours. Cardiac Enzymes: No results for input(s): CKTOTAL, CKMB, CKMBINDEX, TROPONINI in the last 168 hours. BNP (last 3 results) No results for input(s): PROBNP in the last 8760 hours. HbA1C: No results for input(s): HGBA1C in the last 72 hours. CBG: No results for input(s): GLUCAP in the last 168 hours. Lipid Profile: No results for input(s): CHOL, HDL, LDLCALC, TRIG, CHOLHDL, LDLDIRECT in the last 72 hours. Thyroid Function Tests: No results for input(s): TSH, T4TOTAL, FREET4, T3FREE, THYROIDAB in the last 72 hours. Anemia Panel: Recent Labs    07/23/21 2100  VITAMINB12 522  FOLATE 17.2  FERRITIN 3*  TIBC 449  IRON 16*  RETICCTPCT 2.4   Urine analysis:    Component Value Date/Time   COLORURINE COLORLESS (A) 03/10/2017 1711   APPEARANCEUR CLEAR 03/10/2017 1711   LABSPEC 1.010 09/29/2020 1727   PHURINE 5.5 09/29/2020 1727   GLUCOSEU NEGATIVE 09/29/2020 1727   HGBUR SMALL (A) 09/29/2020 1727   BILIRUBINUR NEGATIVE 09/29/2020 1727   KETONESUR NEGATIVE 09/29/2020 1727   PROTEINUR NEGATIVE 09/29/2020 1727   UROBILINOGEN 0.2 09/29/2020 1727   NITRITE NEGATIVE 09/29/2020 1727   LEUKOCYTESUR NEGATIVE 09/29/2020 1727    Radiological Exams on Admission: I have personally reviewed images CT Angio Abd/Pel W and/or Wo Contrast  Result Date: 07/22/2021 CLINICAL DATA:  GI  bleed, lower.  Diarrhea.  Bloody stools EXAM: CTA ABDOMEN AND PELVIS WITHOUT AND WITH CONTRAST TECHNIQUE: Multidetector CT imaging of the abdomen and pelvis was performed using the standard protocol during bolus administration of intravenous contrast. Multiplanar reconstructed images and MIPs were obtained and reviewed to evaluate the vascular anatomy. RADIATION DOSE REDUCTION: This exam was performed according to the departmental dose-optimization program which includes automated exposure control, adjustment of the mA and/or kV according to patient size and/or use of iterative reconstruction technique. CONTRAST:  17mL OMNIPAQUE IOHEXOL 350 MG/ML SOLN COMPARISON:  CT abdomen pelvis 03/11/2017 FINDINGS: VASCULAR No active extravasation of intravenous contrast. Aorta: Normal caliber aorta without aneurysm, dissection, vasculitis or significant stenosis. Celiac: Patent without evidence of aneurysm, dissection, vasculitis or significant stenosis. SMA: Patent without evidence of aneurysm, dissection, vasculitis or significant stenosis. Renals: 2 right renal arteries are noted. The renal arteries are patent without evidence of aneurysm, dissection, vasculitis, fibromuscular dysplasia or significant stenosis. IMA: Patent without evidence of aneurysm, dissection, vasculitis or significant stenosis. Inflow: Patent without evidence of aneurysm, dissection, vasculitis or significant stenosis. Proximal Outflow: Bilateral common femoral and visualized portions of the superficial and profunda femoral arteries are patent without evidence of aneurysm, dissection, vasculitis or significant stenosis. Veins: The portal, splenic, superior mesenteric veins are patent. Inferior vena cava is patent. Review of the MIP images confirms the above findings. NON-VASCULAR Lower chest: No acute abnormality. Hepatobiliary: No focal liver abnormality. No gallstones, gallbladder wall thickening, or pericholecystic fluid. No biliary dilatation.  Pancreas: No focal lesion. Normal pancreatic contour. No surrounding inflammatory changes. No main pancreatic ductal dilatation. Spleen: Normal in size without focal abnormality. Adrenals/Urinary Tract: No adrenal nodule bilaterally. Bilateral kidneys enhance symmetrically. No hydronephrosis. No hydroureter. The urinary bladder is unremarkable. Stomach/Bowel: Stomach is within normal limits. No evidence of bowel wall thickening or dilatation. Appendix appears normal. Lymphatic: No lymphadenopathy. Reproductive: Likely nabothian cysts. Otherwise uterus and bilateral adnexa are unremarkable. Other: No intraperitoneal free fluid. No intraperitoneal free gas. No organized fluid collection. Musculoskeletal: No abdominal wall hernia or abnormality. No suspicious lytic or blastic osseous lesions. No acute displaced fracture. IMPRESSION: VASCULAR 1. No acute vascular abnormality. 2.  No active bleeding identified  on CT angiography. NON-VASCULAR 1. No acute intra-abdominal or intrapelvic abnormality. Electronically Signed   By: Iven Finn M.D.   On: 07/22/2021 20:45    EKG: I have personally reviewed EKG: Sinus rhythm, nonspecific T wave abnormality.  No significant change since prior tracing.  Assessment and Plan: * Acute lower GI bleeding- (present on admission) Symptomatic acute blood loss anemia Colonoscopy done in December 2022 revealed 5 mm rectal HPP, medium internal hemorrhoids.  Rectal bleeding ongoing since after this colonoscopy.  She had a follow-up visit with GI at [facility] yesterday and rectal bleeding was felt to be due to hemorrhoids and constipation.  Although patient denies any constipation at this time.  She is not on any antiplatelet agents or anticoagulation.  Hemoglobin currently 7.5, stable since yesterday but was 10.8 on 05/12/2021.  Reddish-brown stool noted on rectal exam and FOBT positive.  Anemia panel showing low iron and low ferritin.  Patient is hemodynamically stable.   CTA abdomen pelvis negative for active bleeding. -1 unit PRBCs ordered in the ED as she is symptomatic from her anemia (endorsing fatigue).  Follow-up posttransfusion H&H.  Keep n.p.o. at this time, [facility] has been consulted.  H. pylori infection- (present on admission) EGD done in December 2022 revealed mild erythema in the gastric antrum with biopsy showing H. pylori.  Patient was started on quadruple therapy but was not able to tolerate it due to nausea and vomiting.  During GI visit yesterday, she was switched to triple therapy. -Continue triple therapy.  Outpatient GI follow-up in 6 weeks for repeat testing.   DVT prophylaxis: SCDs Code Status: Full Code Family Communication: No family available at this time. Consults called: GI Level of care: Telemetry bed Admission status: It is my clinical opinion that referral for OBSERVATION is reasonable and necessary in this patient based on the above information provided. The aforementioned taken together are felt to place the patient at high risk for further clinical deterioration. However, it is anticipated that the patient may be medically stable for discharge from the hospital within 24 to 48 hours.   Shela Leff, MD [facility] 07/24/2021, 12:47 AM

## 2021-07-23 NOTE — ED Triage Notes (Signed)
Patient states her primary care physician told her to go to the emergency department because her blood count is low and she needs a blood transfusion. Patient reports bloody bowel movements for the last two months. Patient does not take any medications, is alert, oriented, ambulatory, and in no apparent distress at this time.

## 2021-07-23 NOTE — ED Provider Notes (Signed)
[facility] Provider Note   CSN: [id number] Arrival date & time: 07/23/21  1308     History  Chief Complaint  Patient presents with   GI Bleeding    Margaret Morales is a 51 y.o. female.  Patient with blood in her stool for the last several weeks.  Had a colonoscopy several months ago that showed rectal polyp, hemorrhoids.  Margaret Morales has not been having any pain.  Margaret Morales is being treated for H. pylori.  Margaret Morales saw her GI doctor yesterday who sent off lab work that showed low blood count and sent her for evaluation.  Margaret Morales has been having weakness, fatigue and GI team was concerned about symptomatic anemia.  Thought Margaret Morales needed transfusion.  Follows with the [facility] team.  Patient speaks Burmese.  Interpreter used.  The history is provided by the patient.  Rectal Bleeding Quality:  Bright red Amount:  Scant Duration:  2 months Timing:  Constant Chronicity:  New Context: hemorrhoids and spontaneously   Relieved by:  Nothing Worsened by:  Nothing Associated symptoms: no abdominal pain, no dizziness, no epistaxis, no fever, no hematemesis, no light-headedness, no loss of consciousness, no recent illness and no vomiting       Home Medications Prior to Admission medications   Medication Sig Start Date End Date Taking? Authorizing Provider  amoxicillin (AMOXIL) 500 MG tablet Take 1,000 mg by mouth See admin instructions. Bid x 14 days 07/22/21   [provider]  clarithromycin (BIAXIN) 500 MG tablet Take 500 mg by mouth See admin instructions. Bid x 14 days 07/22/21   [provider]  naproxen (NAPROSYN) 375 MG tablet Take 1 tablet (375 mg total) by mouth 2 (two) times daily with a meal. 09/29/20   Jaynee Eagles, PA-C  dicyclomine (BENTYL) 20 MG tablet Take 1 tablet (20 mg total) by mouth 2 (two) times daily. Patient not taking: Reported on 03/10/2017 01/13/17 12/01/18  Jeannett Senior, PA-C  diphenhydrAMINE (BENADRYL) 25 MG tablet Take 1 tablet  (25 mg total) by mouth at bedtime as needed for sleep. 12/01/18 09/29/20  Zigmund Gottron, NP  esomeprazole (NEXIUM) 40 MG capsule Take 1 capsule (40 mg total) by mouth daily. 12/01/18 09/29/20  Zigmund Gottron, NP  famotidine (PEPCID) 20 MG tablet Take 1 tablet (20 mg total) by mouth at bedtime. 12/01/18 09/29/20  Zigmund Gottron, NP  omeprazole (PRILOSEC OTC) 20 MG tablet Take 20 mg by mouth 2 (two) times daily.  11/19/18  [provider]  omeprazole (PRILOSEC) 20 MG capsule Take 1 capsule (20 mg total) by mouth daily. 12/25/18 09/29/20  Raylene Everts, MD  sucralfate (CARAFATE) 1 g tablet Take 1 tablet (1 g total) by mouth 4 (four) times daily -  with meals and at bedtime. 12/25/18 12/25/18  Raylene Everts, MD      Allergies    Patient has no known allergies.    Review of Systems   Review of Systems  Constitutional:  Negative for fever.  HENT:  Negative for nosebleeds.   Gastrointestinal:  Positive for hematochezia. Negative for abdominal pain, hematemesis and vomiting.  Neurological:  Negative for dizziness, loss of consciousness and light-headedness.   Physical Exam Updated Vital Signs BP 121/66    Pulse 81    Temp 98.2 F (36.8 C)    Resp 16    LMP 09/13/2016    SpO2 99%  Physical Exam Vitals and nursing note reviewed.  Constitutional:  General: Margaret Morales is not in acute distress.    Appearance: Margaret Morales is well-developed. Margaret Morales is not ill-appearing.  HENT:     Head: Normocephalic and atraumatic.     Nose: Nose normal.     Mouth/Throat:     Mouth: Mucous membranes are moist.  Eyes:     Conjunctiva/sclera: Conjunctivae normal.     Pupils: Pupils are equal, round, and reactive to light.  Cardiovascular:     Rate and Rhythm: Normal rate and regular rhythm.     Pulses: Normal pulses.     Heart sounds: Normal heart sounds. No murmur heard. Pulmonary:     Effort: Pulmonary effort is normal. No respiratory distress.     Breath sounds: Normal breath sounds.  Abdominal:      General: There is no distension.     Palpations: Abdomen is soft.     Tenderness: There is no abdominal tenderness.  Genitourinary:    Rectum: Guaiac result positive.  Musculoskeletal:        General: No swelling.     Cervical back: Normal range of motion and neck supple.  Skin:    General: Skin is warm and dry.     Capillary Refill: Capillary refill takes less than 2 seconds.  Neurological:     General: No focal deficit present.     Mental Status: Margaret Morales is alert.  Psychiatric:        Mood and Affect: Mood normal.    ED Results / Procedures / Treatments   Labs (all labs ordered are listed, but only abnormal results are displayed) Labs Reviewed  CBC - Abnormal; Notable for the following components:      Result Value   RBC 2.91 (*)    Hemoglobin 7.5 (*)    HCT 24.8 (*)    MCH 25.8 (*)    All other components within normal limits  POC OCCULT BLOOD, ED - Abnormal; Notable for the following components:   Fecal Occult Bld POSITIVE (*)    All other components within normal limits  RESP PANEL BY RT-PCR (FLU A&B, COVID) ARPGX2  COMPREHENSIVE METABOLIC PANEL  VITAMIN 123456  FOLATE  IRON AND TIBC  FERRITIN  RETICULOCYTES  PREPARE RBC (CROSSMATCH)  TYPE AND SCREEN    EKG None  Radiology CT Angio Abd/Pel W and/or Wo Contrast  Result Date: 07/22/2021 CLINICAL DATA:  GI bleed, lower.  Diarrhea.  Bloody stools EXAM: CTA ABDOMEN AND PELVIS WITHOUT AND WITH CONTRAST TECHNIQUE: Multidetector CT imaging of the abdomen and pelvis was performed using the standard protocol during bolus administration of intravenous contrast. Multiplanar reconstructed images and MIPs were obtained and reviewed to evaluate the vascular anatomy. RADIATION DOSE REDUCTION: This exam was performed according to the departmental dose-optimization program which includes automated exposure control, adjustment of the mA and/or kV according to patient size and/or use of iterative reconstruction technique. CONTRAST:  155mL  OMNIPAQUE IOHEXOL 350 MG/ML SOLN COMPARISON:  CT abdomen pelvis 03/11/2017 FINDINGS: VASCULAR No active extravasation of intravenous contrast. Aorta: Normal caliber aorta without aneurysm, dissection, vasculitis or significant stenosis. Celiac: Patent without evidence of aneurysm, dissection, vasculitis or significant stenosis. SMA: Patent without evidence of aneurysm, dissection, vasculitis or significant stenosis. Renals: 2 right renal arteries are noted. The renal arteries are patent without evidence of aneurysm, dissection, vasculitis, fibromuscular dysplasia or significant stenosis. IMA: Patent without evidence of aneurysm, dissection, vasculitis or significant stenosis. Inflow: Patent without evidence of aneurysm, dissection, vasculitis or significant stenosis. Proximal Outflow: Bilateral common femoral and visualized portions of  the superficial and profunda femoral arteries are patent without evidence of aneurysm, dissection, vasculitis or significant stenosis. Veins: The portal, splenic, superior mesenteric veins are patent. Inferior vena cava is patent. Review of the MIP images confirms the above findings. NON-VASCULAR Lower chest: No acute abnormality. Hepatobiliary: No focal liver abnormality. No gallstones, gallbladder wall thickening, or pericholecystic fluid. No biliary dilatation. Pancreas: No focal lesion. Normal pancreatic contour. No surrounding inflammatory changes. No main pancreatic ductal dilatation. Spleen: Normal in size without focal abnormality. Adrenals/Urinary Tract: No adrenal nodule bilaterally. Bilateral kidneys enhance symmetrically. No hydronephrosis. No hydroureter. The urinary bladder is unremarkable. Stomach/Bowel: Stomach is within normal limits. No evidence of bowel wall thickening or dilatation. Appendix appears normal. Lymphatic: No lymphadenopathy. Reproductive: Likely nabothian cysts. Otherwise uterus and bilateral adnexa are unremarkable. Other: No intraperitoneal free  fluid. No intraperitoneal free gas. No organized fluid collection. Musculoskeletal: No abdominal wall hernia or abnormality. No suspicious lytic or blastic osseous lesions. No acute displaced fracture. IMPRESSION: VASCULAR 1. No acute vascular abnormality. 2.  No active bleeding identified on CT angiography. NON-VASCULAR 1. No acute intra-abdominal or intrapelvic abnormality. Electronically Signed   By: Iven Finn M.D.   On: 07/22/2021 20:45    Procedures .Critical Care Performed by: Lennice Sites, DO Authorized by: Lennice Sites, DO   Critical care provider statement:    Critical care time (minutes):  35   Critical care was necessary to treat or prevent imminent or life-threatening deterioration of the following conditions: symptomatic anemia.   Critical care was time spent personally by me on the following activities:  Blood draw for specimens, development of treatment plan with patient or surrogate, discussions with primary provider, evaluation of patient's response to treatment, examination of patient, obtaining history from patient or surrogate, ordering and performing treatments and interventions, ordering and review of laboratory studies, ordering and review of radiographic studies, pulse oximetry, re-evaluation of patient's condition and review of old charts   I assumed direction of critical care for this patient from another provider in my specialty: no     Care discussed with: admitting provider      Medications Ordered in ED Medications  0.9 %  sodium chloride infusion (has no administration in time range)    ED Course/ Medical Decision Making/ A&P                           Medical Decision Making Amount and/or Complexity of Data Reviewed Labs: ordered.  Risk Prescription drug management. Decision regarding hospitalization.   Irielle Gover is here with rectal bleeding.  Normal vitals.  No fever.  No significant medical history.  Currently being treated for H. pylori.   Colonoscopy per chart review about 2 months ago showed rectal polyp and hemorrhoids.  Margaret Morales has been having blood in her stool basically since her colonoscopy.  Margaret Morales saw her GI doctor yesterday who works in the [facility] system who sent blood work that showed hemoglobin of 7.5.  Margaret Morales is having weakness and fatigue and they were concerned for symptomatic anemia and check labs.  Margaret Morales came here after being told that her blood counts were low and likely needed transfusion.  Patient denies any abdominal pain, no falls.  Differential diagnosis includes chronic GI bleed from hemorrhoids/polyp versus less likely upper GI bleed.  Likely symptomatic anemia.  We will get CBC, CMP.  Hemoccult was done that showed positive Hemoccult.  Margaret Morales had grossly reddish-brown stool on exam.  Per my review  and interpretation of labs patient with significant hemoglobin of 7.5.  Stable from yesterday.  However given her fatigue and shortness of breath suspect that this is symptomatic anemia causing her symptoms.  We will transfuse her unit of blood.  I will admit her to medicine.  [facility] team, Dr. Rush Landmark sent message on epic.  This chart was dictated using voice recognition software.  Despite best efforts to proofread,  errors can occur which can change the documentation meaning.         Final Clinical Impression(s) / ED Diagnoses Final diagnoses:  Symptomatic anemia  Acute GI bleeding    Rx / DC Orders ED Discharge Orders     None         Lennice Sites, DO 07/23/21 2129

## 2021-07-23 NOTE — ED Provider Triage Note (Signed)
Emergency Medicine Provider Triage Evaluation Note  Margaret Morales , a 51 y.o. female  was evaluated in triage.  Pt complains of abdominal pain and hematochezia. States same has been ongoing for some time, however has been worse over the past 3 days. She feels fatigued, lightheaded.  No prior colonoscopy.  She has pain to her left lower abdomen.  Was seen by GI and sent here after being found to be anemic on blood work.  Originally presented yesterday but left without being seen due to long wait times.  States that nothing is changed since he was seen yesterday.  Review of Systems  Positive: Hematochezia, LLQ pain Negative: Fevers, chills  Physical Exam  BP 129/80 (BP Location: Right Arm)    Pulse 91    Temp 98.4 F (36.9 C) (Oral)    Resp 18    LMP 09/13/2016    SpO2 100%  Gen:   Awake, no distress   Resp:  Normal effort  MSK:   Moves extremities without difficulty  Other:    Medical Decision Making  Medically screening exam initiated at 1:58 PM.  Appropriate orders placed.  Margaret Morales was informed that the remainder of the evaluation will be completed by another provider, this initial triage assessment does not replace that evaluation, and the importance of remaining in the ED until their evaluation is complete.  Hemoglobin 7.6 yesterday will redraw.  Had CTA abdomen pelvis with contrast yesterday which was unremarkable   [name], PA-C 07/23/21 1402

## 2021-07-24 DIAGNOSIS — D649 Anemia, unspecified: Secondary | ICD-10-CM | POA: Diagnosis not present

## 2021-07-24 DIAGNOSIS — K649 Unspecified hemorrhoids: Secondary | ICD-10-CM

## 2021-07-24 DIAGNOSIS — A048 Other specified bacterial intestinal infections: Secondary | ICD-10-CM | POA: Diagnosis not present

## 2021-07-24 DIAGNOSIS — K922 Gastrointestinal hemorrhage, unspecified: Secondary | ICD-10-CM | POA: Diagnosis not present

## 2021-07-24 LAB — CBC
HCT: 29 % — ABNORMAL LOW (ref 36.0–46.0)
Hemoglobin: 9 g/dL — ABNORMAL LOW (ref 12.0–15.0)
MCH: 27 pg (ref 26.0–34.0)
MCHC: 31 g/dL (ref 30.0–36.0)
MCV: 87.1 fL (ref 80.0–100.0)
Platelets: 272 10*3/uL (ref 150–400)
RBC: 3.33 MIL/uL — ABNORMAL LOW (ref 3.87–5.11)
RDW: 15.1 % (ref 11.5–15.5)
WBC: 4 10*3/uL (ref 4.0–10.5)
nRBC: 0 % (ref 0.0–0.2)

## 2021-07-24 LAB — HIV ANTIBODY (ROUTINE TESTING W REFLEX): HIV Screen 4th Generation wRfx: NONREACTIVE

## 2021-07-24 MED ORDER — CLARITHROMYCIN 500 MG PO TABS
500.0000 mg | ORAL_TABLET | Freq: Two times a day (BID) | ORAL | Status: DC
  Administered 2021-07-24: 500 mg via ORAL
  Filled 2021-07-24 (×3): qty 1

## 2021-07-24 MED ORDER — AMOXICILLIN 500 MG PO CAPS
1000.0000 mg | ORAL_CAPSULE | Freq: Two times a day (BID) | ORAL | Status: DC
  Administered 2021-07-24 (×2): 1000 mg via ORAL
  Filled 2021-07-24 (×2): qty 2

## 2021-07-24 MED ORDER — DOCUSATE SODIUM 100 MG PO CAPS
100.0000 mg | ORAL_CAPSULE | Freq: Two times a day (BID) | ORAL | Status: DC
  Administered 2021-07-24: 100 mg via ORAL
  Filled 2021-07-24: qty 1

## 2021-07-24 MED ORDER — ACETAMINOPHEN 325 MG PO TABS
650.0000 mg | ORAL_TABLET | Freq: Four times a day (QID) | ORAL | Status: DC | PRN
  Filled 2021-07-24: qty 2

## 2021-07-24 MED ORDER — SODIUM CHLORIDE 0.9 % IV SOLN
250.0000 mg | Freq: Every day | INTRAVENOUS | Status: DC
  Administered 2021-07-24: 250 mg via INTRAVENOUS
  Filled 2021-07-24 (×3): qty 20

## 2021-07-24 MED ORDER — HYDROCORTISONE ACETATE 25 MG RE SUPP: 25.0000 mg | Freq: Two times a day (BID) | RECTAL | 0 refills | Status: AC

## 2021-07-24 MED ORDER — PANTOPRAZOLE SODIUM 40 MG PO TBEC
40.0000 mg | DELAYED_RELEASE_TABLET | Freq: Two times a day (BID) | ORAL | Status: DC
  Administered 2021-07-24 (×2): 40 mg via ORAL
  Filled 2021-07-24 (×2): qty 1

## 2021-07-24 MED ORDER — HYDROCORTISONE ACETATE 25 MG RE SUPP
25.0000 mg | Freq: Two times a day (BID) | RECTAL | Status: DC
  Administered 2021-07-24: 25 mg via RECTAL
  Filled 2021-07-24 (×2): qty 1

## 2021-07-24 MED ORDER — POLYETHYLENE GLYCOL 3350 17 G PO PACK: 17.0000 g | PACK | Freq: Two times a day (BID) | ORAL | 0 refills | Status: AC

## 2021-07-24 NOTE — TOC Initial Note (Signed)
Transition of Care [facility]) - Initial/Assessment Note    Patient Details  Name: Margaret Morales MRN: [id number] Date of Birth: 02-09-1971  Transition of Care [facility]) CM/SW Contact:    Verdell Carmine, RN Phone Number: 07/24/2021, 12:09 PM  Clinical Narrative:                 The Transition of Care Department [facility]) has reviewed patient and no TOC needs have been identified at this time. We will continue to monitor patient advancement through interdisciplinary progression rounds. If new patient transition needs arise, please place a TOC consult          Patient Goals and CMS Choice        Expected Discharge Plan and Services                                                Prior Living Arrangements/Services                       Activities of Daily Living      Permission Sought/Granted                  Emotional Assessment              Admission diagnosis:  Acute lower GI bleeding [K92.2] Acute GI bleeding [K92.2] Symptomatic anemia [D64.9] Patient Active Problem List   Diagnosis Date Noted   H. pylori infection 07/24/2021   Symptomatic anemia    Hemorrhoids    Acute lower GI bleeding 07/23/2021   PCP:  Gustavus Bryant, PA-C Pharmacy:   [facility] [address] [place] [address] Phone: [phone] Fax: [phone]     Social Determinants of Health (SDOH) Interventions    Readmission Risk Interventions No flowsheet data found.

## 2021-07-24 NOTE — Progress Notes (Signed)
Patient admitted to room and oriented to room and call light and plan of care with provider and interpreter services. Discharged following iron infusion. AVS reviewed with patient and son. Tele and PIV discontinued and patient discharged home via private vehicle.

## 2021-07-24 NOTE — Consult Note (Addendum)
[facility] Consult: 9:29 AM 07/24/2021  LOS: 1 day    Referring Provider: Dr Cruzita Lederer  Primary Care Physician:  Cristy Hilts at [place]. Primary Gastroenterologist:  Dr. Juanda Crumble at [facility] in [facility].  .       Reason for Consultation: Anemia, iron deficiency, painless rectal bleeding attributed to hemorrhoids.   HPI: Margaret Morales is a 51 y.o. female.  PMH uterine fibroids. Established  care with Dr.Badreddine in December 2022 for evaluation several years mild, intermittent left lower quadrant abdominal pain.  Had been associated with constipation but pain persisted despite addition of MiraLAX and resolution of constipation.  Additionally having occasional hematochezia she attributed to hemorrhoids and straining.  Complained of dyspepsia, GERD.   05/13/21 EGD: mild gastric erythema, positive H. pylori.  Rxd quadruple therapy (Amoxil, bismuth, Biaxin, PPI ) with plans for future H. pylori stool testing.  However, only took abx regimen for 2 days athen stopped due to nausea and vomiting. 05/13/21 Colonoscopy with 5 mm rectal hyperplastic polyp, nonbleeding internal hemorrhoids.  Planned follow-up colonoscopy in 10 years. At GI office follow-up w Debria Garret PA-C  07/22/2021 pt reported inability to complete antibiotic regimen and reported rectal bleeding with bowel movements.  Volume excessive at times.  APP prescribed triple therapy (amoxicillin 1 g bid, Biaxin 500 mg bid, Bismuth tablets 2 po bid, omeprazole 20 mg twice daily)for H. pylori with plans to check stool antigen in future.  Initiated Anusol-HC suppositories twice daily for a week, Sitz baths,  fiber supplement, continue MiraLAX.  Ordered labs of CBC, CMP.  Hgb of 7.6, MCV 78.7. CMET  unremarkable.  Patient advised to proceed to emergency room and presented to [facility]. Patient picked up the prescriptions for the 2 new antibiotics started these yesterday.  She did not understand that she was also supposed to be taking omeprazole and Bismuth along with these and actually has not been taking omeprazole since early January.  Also not using and apparently did not get Rx for the Anusol suppositories.  Does not use NSAIDs.  Although she has stools sometimes they are hard.  Describes the bleeding as occurring daily with a bowel movement, does not have bleeding otherwise.  Volume of blood sounds mild to moderate, she sees blood in the commode water but has not had large-volume hematochezia.  In speaking with her it is apparent that, even with Burmese translator/interpreter, providers are not getting accurate information from the patient regarding what medicines she is taking and the patient is not understanding what medicines she needs to be taking.  Besides not taking omeprazole, she has not been using suppositories or MiraLAX which are listed in the med list.  Hgb 7.5 >> 1 PRBC >> 9. Was 10.8 nine weeks ago. Interestingly the MCV is 86.  Platelets normal. Iron low at 16, iron sats low at 4%, ferritin low at 3.  Folate, B12, TIBC normal. CTAP unremarkable, no active bleeding, no intestinal abnormalities.  Liver, GB, pancreas normal.  Lives with her  husband.  Does not work outside the home.  No children. No alcohol, no tobacco. Family history negative for anemia, gastrointestinal cancers, bleeding disorders.   Prior to Admission medications   Medication Sig Start Date End Date Taking? Authorizing Provider  amoxicillin (AMOXIL) 500 MG tablet Take 1,000 mg by mouth See admin instructions. Bid x 14 days 07/22/21  Yes [provider]  clarithromycin (BIAXIN) 500 MG tablet Take 500 mg by mouth See admin instructions. Bid x 14 days 07/22/21  Yes [provider]  Omega-3  Fatty Acids (FISH OIL PO) Take 1 capsule by mouth every other day.   Yes [provider]  bismuth subsalicylate (PEPTO BISMOL) 262 MG chewable tablet Chew 524 mg by mouth See admin instructions. Qid x 14 days 07/22/21 08/05/21  [provider]  hydrocortisone (ANUSOL-HC) 25 MG suppository Place 25 suppositories rectally See admin instructions. Bid x 6 days 07/22/21 07/28/21  [provider]  naproxen (NAPROSYN) 375 MG tablet Take 1 tablet (375 mg total) by mouth 2 (two) times daily with a meal. Patient not taking: Reported on 07/23/2021 09/29/20   Jaynee Eagles, PA-C  omeprazole (PRILOSEC) 20 MG capsule Take 20 mg by mouth See admin instructions. Bid x 14 days 07/22/21 08/05/21  [provider]  dicyclomine (BENTYL) 20 MG tablet Take 1 tablet (20 mg total) by mouth 2 (two) times daily. Patient not taking: Reported on 03/10/2017 01/13/17 12/01/18  Jeannett Senior, PA-C  diphenhydrAMINE (BENADRYL) 25 MG tablet Take 1 tablet (25 mg total) by mouth at bedtime as needed for sleep. 12/01/18 09/29/20  Zigmund Gottron, NP  esomeprazole (NEXIUM) 40 MG capsule Take 1 capsule (40 mg total) by mouth daily. 12/01/18 09/29/20  Zigmund Gottron, NP  famotidine (PEPCID) 20 MG tablet Take 1 tablet (20 mg total) by mouth at bedtime. 12/01/18 09/29/20  Zigmund Gottron, NP  omeprazole (PRILOSEC OTC) 20 MG tablet Take 20 mg by mouth 2 (two) times daily.  11/19/18  [provider]  sucralfate (CARAFATE) 1 g tablet Take 1 tablet (1 g total) by mouth 4 (four) times daily -  with meals and at bedtime. 12/25/18 12/25/18  Raylene Everts, MD    Scheduled Meds:  amoxicillin  1,000 mg Oral BID   clarithromycin  500 mg Oral BID   pantoprazole  40 mg Oral BID   Infusions:  PRN Meds: acetaminophen   Allergies as of 07/23/2021   (No Known Allergies)    Family History  Family history unknown: Yes    Social History   Socioeconomic History   Marital status: Married    Spouse name: Not  on file   Number of children: Not on file   Years of education: Not on file   Highest education level: Not on file  Occupational History   Not on file  Tobacco Use   Smoking status: Never   Smokeless tobacco: Never  Vaping Use   Vaping Use: Never used  Substance and Sexual Activity   Alcohol use: No   Drug use: Not on file   Sexual activity: Not on file  Other Topics Concern   Not on file  Social History Narrative   Not on file   Social Determinants of Health   Financial Resource Strain: Not on file  Food Insecurity: Not on file  Transportation Needs: Not on file  Physical Activity: Not on file  Stress: Not on file  Social Connections: Not on file  Intimate Partner Violence: Not on file  REVIEW OF SYSTEMS: Constitutional: No profound weakness or fatigue. ENT:  No nose bleeds Pulm: No shortness of breath or cough CV:  No palpitations, no LE edema.  No angina GU:  No hematuria, no frequency GI: See HPI. Heme: Other than the rectal bleeding, no unusual or excessive bleeding/bruising Transfusions: 1 unit RBCs since arrival. Neuro:  No headaches, no peripheral tingling or numbness.  No syncope, no seizures Derm:  No itching, no rash or sores.  Endocrine:  No sweats or chills.  No polyuria or dysuria Immunization: Not queried Travel: Not queried   PHYSICAL EXAM: Vital signs in last 24 hours: Vitals:   07/24/21 0600 07/24/21 0900  BP: 137/73 123/75  Pulse: 74 75  Resp: 15 13  Temp:    SpO2: 100% 99%   Wt Readings from Last 3 Encounters:  07/22/21 71.7 kg  05/12/21 71.7 kg    General: Patient looks well.  Resting comfortably on bed in ED room. Head: No facial asymmetry or swelling.  No signs of head trauma. Eyes: No scleral icterus.  Conjunctiva not pale. Ears: Not hard of hearing Nose: No congestion or discharge Mouth: Good dentition.  Oropharynx moist, pink, clear.  Tongue midline. Neck: No JVD, no masses, no thyromegaly Lungs: Clear to auscultation  bilaterally. Heart: RRR.  No MRG.  S1, S2 present Abdomen: Soft.  Not tender.  Not distended.  No HSM, masses, bruits, hernias..   Rectal: Hard stool palpable at distal extent of digital exam.  Moderate to large external hemorrhoids, not thrombosed but areas are raw and look to have been bleeding recently. Musc/Skeltl: No joint redness, swelling or gross deformity Extremities: No CCE. Neurologic: Alert.  Oriented x3.  Moves all 4 limbs.  No tremors or gross weakness Skin: No telangiectasia, rash, sores Nodes: No cervical adenopathy Psych: Cooperative, calm, pleasant.  Intake/Output from previous day: No intake/output data recorded. Intake/Output this shift: No intake/output data recorded.  LAB RESULTS: Recent Labs    07/22/21 1755 07/23/21 1342 07/24/21 0536  WBC 6.0 4.4 4.0  HGB 7.6* 7.5* 9.0*  HCT 25.8* 24.8* 29.0*  PLT 326 310 272   BMET Lab Results  Component Value Date   NA 139 07/23/2021   NA 143 07/22/2021   NA 123 (L) 05/12/2021   K 4.4 07/23/2021   K 4.1 07/22/2021   K 3.0 (L) 05/12/2021   CL 107 07/23/2021   CL 110 07/22/2021   CL 91 (L) 05/12/2021   CO2 24 07/23/2021   CO2 24 07/22/2021   CO2 19 (L) 05/12/2021   GLUCOSE 94 07/23/2021   GLUCOSE 108 (H) 07/22/2021   GLUCOSE 140 (H) 05/12/2021   BUN 7 07/23/2021   BUN 8 07/22/2021   BUN 8 05/12/2021   CREATININE 0.69 07/23/2021   CREATININE 0.75 07/22/2021   CREATININE 0.67 05/12/2021   CALCIUM 9.2 07/23/2021   CALCIUM 9.6 07/22/2021   CALCIUM 9.0 05/12/2021   LFT Recent Labs    07/22/21 1755 07/23/21 1342  PROT 7.9 7.4  ALBUMIN 4.0 3.8  AST 21 19  ALT 14 12  ALKPHOS 61 68  BILITOT 0.3 0.3   PT/INR No results found for: INR, PROTIME Hepatitis Panel No results for input(s): HEPBSAG, HCVAB, HEPAIGM, HEPBIGM in the last 72 hours. C-Diff No components found for: CDIFF Lipase     Component Value Date/Time   LIPASE 29 07/22/2021 1755    Drugs of Abuse  No results found for: LABOPIA,  COCAINSCRNUR, LABBENZ, AMPHETMU, THCU, LABBARB   RADIOLOGY STUDIES:  CT Angio Abd/Pel W and/or Wo Contrast  Result Date: 07/22/2021 CLINICAL DATA:  GI bleed, lower.  Diarrhea.  Bloody stools EXAM: CTA ABDOMEN AND PELVIS WITHOUT AND WITH CONTRAST TECHNIQUE: Multidetector CT imaging of the abdomen and pelvis was performed using the standard protocol during bolus administration of intravenous contrast. Multiplanar reconstructed images and MIPs were obtained and reviewed to evaluate the vascular anatomy. RADIATION DOSE REDUCTION: This exam was performed according to the departmental dose-optimization program which includes automated exposure control, adjustment of the mA and/or kV according to patient size and/or use of iterative reconstruction technique. CONTRAST:  18mL OMNIPAQUE IOHEXOL 350 MG/ML SOLN COMPARISON:  CT abdomen pelvis 03/11/2017 FINDINGS: VASCULAR No active extravasation of intravenous contrast. Aorta: Normal caliber aorta without aneurysm, dissection, vasculitis or significant stenosis. Celiac: Patent without evidence of aneurysm, dissection, vasculitis or significant stenosis. SMA: Patent without evidence of aneurysm, dissection, vasculitis or significant stenosis. Renals: 2 right renal arteries are noted. The renal arteries are patent without evidence of aneurysm, dissection, vasculitis, fibromuscular dysplasia or significant stenosis. IMA: Patent without evidence of aneurysm, dissection, vasculitis or significant stenosis. Inflow: Patent without evidence of aneurysm, dissection, vasculitis or significant stenosis. Proximal Outflow: Bilateral common femoral and visualized portions of the superficial and profunda femoral arteries are patent without evidence of aneurysm, dissection, vasculitis or significant stenosis. Veins: The portal, splenic, superior mesenteric veins are patent. Inferior vena cava is patent. Review of the MIP images confirms the above findings. NON-VASCULAR Lower chest: No  acute abnormality. Hepatobiliary: No focal liver abnormality. No gallstones, gallbladder wall thickening, or pericholecystic fluid. No biliary dilatation. Pancreas: No focal lesion. Normal pancreatic contour. No surrounding inflammatory changes. No main pancreatic ductal dilatation. Spleen: Normal in size without focal abnormality. Adrenals/Urinary Tract: No adrenal nodule bilaterally. Bilateral kidneys enhance symmetrically. No hydronephrosis. No hydroureter. The urinary bladder is unremarkable. Stomach/Bowel: Stomach is within normal limits. No evidence of bowel wall thickening or dilatation. Appendix appears normal. Lymphatic: No lymphadenopathy. Reproductive: Likely nabothian cysts. Otherwise uterus and bilateral adnexa are unremarkable. Other: No intraperitoneal free fluid. No intraperitoneal free gas. No organized fluid collection. Musculoskeletal: No abdominal wall hernia or abnormality. No suspicious lytic or blastic osseous lesions. No acute displaced fracture. IMPRESSION: VASCULAR 1. No acute vascular abnormality. 2.  No active bleeding identified on CT angiography. NON-VASCULAR 1. No acute intra-abdominal or intrapelvic abnormality. Electronically Signed   By: Iven Finn M.D.   On: 07/22/2021 20:45     IMPRESSION:     Painless rectal bleeding.  Chronic.  Recent colonoscopy and EGD with findings of hemorrhoids, hyperplastic colon polyp, mild H. pylori gastritis.  Patient has not had any topical medical therapy for the hemorrhoids thus far.  GI plan was for Anusol HC suppositories but not sure if these were not prescribed or patient did not pick them up, in any event she has not been using these.  GERD.  H. pylori positive gastritis.  Intolerant to previous antibiotic regimen and new regimen prescribed by GI 2 days ago, she started this and continues now on amoxicillin, Biaxin and Protonix.  Not receiving bismuth as per prescribed regimen.  Has been complaining of mild to moderate GERD symptoms  but this is not surprising given that she has not been taking any PPI let alone had the H. pylori treated.  IDA in setting of chronic mild to moderate rectal bleeding for at least 3 months.  Patient may have underlying hematologic disorder.  Need to get the rectal bleeding under control, attempt this with conservative medical  management but if bleeding persists would need endoscopic or surgical management of the hemorrhoids.  I do not think we are there yet given she has not had consistent medical therapy for the hemorrhoids.    PLAN:     Continue to treat the H. pylori with Biaxin, amoxicillin, PPI.  Will add bismuth to regimen which was the plan as of office visit 2 days ago.  No need to repeat endoscopic testing just performed 10 weeks ago.  Also going to add high dose colace.    Ordered ferrlecit after consulting with pharmacist.  This is a dose of 250 mg IV daily, pharmacist recommended 4 doses.  Pt will not be at the hospital this long but at least she can get 1 or 2 doses while she is here.  Resume diet  Follow-up with [facility].  At the time of discharge, while it is going to take some time, it will be important that the medical regimen and plan going forward is discussed with the patient using the video interpreter so that she understands what she needs to do, medicines to take and plans going forward.  GI will not plan to follow patient.  Suspect she will be able to go home within 24 hours, if not sooner  Follow-up CBCs as an outpatient, this can be done by her primary care providers.  If the hemorrhoidal bleeding stops and she has persistent anemia, she needs referral to a hematologist.   Azucena Freed  07/24/2021, 9:29 AM Phone [phone]

## 2021-07-24 NOTE — Assessment & Plan Note (Addendum)
Symptomatic acute blood loss anemia due to hemorrhoids -Colonoscopy done in December 2022 revealed 5 mm rectal HPP, medium internal hemorrhoids.  Rectal bleeding ongoing since after this colonoscopy for the past 6 weeks.  She had a follow-up visit with GI at [facility] yesterday and rectal bleeding was felt to be due to hemorrhoids and constipation. She is not on any antiplatelet agents or anticoagulation.  Hemoglobin on admission was 7.5, she was transfused unit of packed red blood cells and improved appropriately to 9.0.  She underwent a CT angio of the abdomen and pelvis which is negative for acute bleeding.  Patient is remained hemodynamically stable.  Gastroenterology consulted and evaluated patient, recommending hemorrhoidal treatment, and signed off.  She will be placed on MiraLAX as well as Anusol suppositories.  She was given iron infusion.  Given chronicity of her blood loss she will be discharged home in stable condition, will need to be reevaluated as an outpatient with a primary gastroenterologist within a week, may need surgery consultation for banding

## 2021-07-24 NOTE — Assessment & Plan Note (Addendum)
EGD done in December 2022 revealed mild erythema in the gastric antrum with biopsy showing H. pylori.  Continue tx

## 2021-07-24 NOTE — Discharge Summary (Signed)
Physician Discharge Summary  Margaret Morales [id number] DOB: 1971/05/21 DOA: 07/23/2021  PCP: Gustavus Bryant, PA-C  Admit date: 07/23/2021 Discharge date: 07/24/2021  Admitted From: home Disposition:  home  Recommendations for Outpatient Follow-up:  Follow up with Primary gastroenterologist in 1 week Please obtain BMP/CBC in one week  Home Health: none Equipment/Devices: none  Discharge Condition: stable CODE STATUS: Full code Diet recommendation: regular  HPI: Per admitting MD, Margaret Morales is a 51 y.o. female with medical history significant of H. pylori, GERD, dyspepsia, hemorrhoids.  Patient is seen by gastroenterology at [facility].  She had colonoscopy and EGD done in December 2022.  EGD revealed mild erythema in gastric antrum with biopsy showing H. pylori.  She was started on treatment for H. pylori with quadruple therapy.  Colonoscopy revealed 5 mm rectal HPP, medium internal hemorrhoids.  Patient had a follow-up visit with GI yesterday and reported ongoing rectal bleeding since after her colonoscopy which was felt to be due to hemorrhoids and constipation.  Patient was started on Anusol suppositories, daily fiber supplement, MiraLAX, and sitz bath's.  Since patient was not able to tolerate quadruple therapy for H. pylori due to nausea and vomiting, she was switched to triple therapy.  Labs were drawn during this visit which revealed low hemoglobin of 7.6 and patient was sent to the ED for blood transfusion due to concern for symptomatic anemia. Hemoglobin was 10.8 on 05/12/2021.  In the ED, vital signs stable.  Labs revealed hemoglobin 7.5.  Reddish-brown stool noted on rectal exam and FOBT positive.  Anemia panel showing low iron and low ferritin.  CTA abdomen pelvis negative for active bleeding. 1 unit PRBCs ordered.  [facility] (Dr. Rush Landmark) consulted via secure chat. Patient speaks Burmese and interpreter services used.  She endorses ongoing rectal bleeding (bright red blood  per rectum) since after her colonoscopy in December.  States it happens every times she has a bowel movement.  Also endorsing left lower quadrant abdominal pain.  Denies any rectal pain or constipation.  She has not vomited blood.  She does not take any blood thinners.  Reports fatigue and occasional dizziness.  Denies chest pain or shortness of breath.  Hospital Course / Discharge diagnoses: Assessment and Plan: * Acute lower GI bleeding- (present on admission) Symptomatic acute blood loss anemia due to hemorrhoids -Colonoscopy done in December 2022 revealed 5 mm rectal HPP, medium internal hemorrhoids.  Rectal bleeding ongoing since after this colonoscopy for the past 6 weeks.  She had a follow-up visit with GI at [facility] yesterday and rectal bleeding was felt to be due to hemorrhoids and constipation. She is not on any antiplatelet agents or anticoagulation.  Hemoglobin on admission was 7.5, she was transfused unit of packed red blood cells and improved appropriately to 9.0.  She underwent a CT angio of the abdomen and pelvis which is negative for acute bleeding.  Patient is remained hemodynamically stable.  Gastroenterology consulted and evaluated patient, recommending hemorrhoidal treatment, and signed off.  She will be placed on MiraLAX as well as Anusol suppositories.  She was given iron infusion.  Given chronicity of her blood loss she will be discharged home in stable condition, will need to be reevaluated as an outpatient with a primary gastroenterologist within a week, may need surgery consultation for banding  H. pylori infection- (present on admission) EGD done in December 2022 revealed mild erythema in the gastric antrum with biopsy showing H. pylori.  Continue tx  Sepsis ruled out   Discharge Instructions   Allergies as of 07/24/2021   No Known Allergies      Medication List     STOP taking these medications    naproxen 375 MG tablet Commonly known as:  NAPROSYN       TAKE these medications    amoxicillin 500 MG tablet Commonly known as: AMOXIL Take 1,000 mg by mouth See admin instructions. Bid x 14 days   bismuth subsalicylate 99991111 MG chewable tablet Commonly known as: PEPTO BISMOL Chew 524 mg by mouth See admin instructions. Qid x 14 days   clarithromycin 500 MG tablet Commonly known as: BIAXIN Take 500 mg by mouth See admin instructions. Bid x 14 days   FISH OIL PO Take 1 capsule by mouth every other day.   hydrocortisone 25 MG suppository Commonly known as: ANUSOL-HC Place 1 suppository (25 mg total) rectally 2 (two) times daily for 10 days. What changed:  how much to take when to take this additional instructions   omeprazole 20 MG capsule Commonly known as: PRILOSEC Take 20 mg by mouth See admin instructions. Bid x 14 days   polyethylene glycol 17 g packet Commonly known as: MiraLax Take 17 g by mouth 2 (two) times daily for 10 days.        Follow-up Information     Mart Piggs, PA-C Follow up in 1 week(s).   Specialty: Gastroenterology Contact information: [address] [place] [address] [phone]                 Consultations: GI  Procedures/Studies:  CT Angio Abd/Pel W and/or Wo Contrast  Result Date: 07/22/2021 CLINICAL DATA:  GI bleed, lower.  Diarrhea.  Bloody stools EXAM: CTA ABDOMEN AND PELVIS WITHOUT AND WITH CONTRAST TECHNIQUE: Multidetector CT imaging of the abdomen and pelvis was performed using the standard protocol during bolus administration of intravenous contrast. Multiplanar reconstructed images and MIPs were obtained and reviewed to evaluate the vascular anatomy. RADIATION DOSE REDUCTION: This exam was performed according to the departmental dose-optimization program which includes automated exposure control, adjustment of the mA and/or kV according to patient size and/or use of iterative reconstruction technique. CONTRAST:  18mL OMNIPAQUE IOHEXOL 350 MG/ML  SOLN COMPARISON:  CT abdomen pelvis 03/11/2017 FINDINGS: VASCULAR No active extravasation of intravenous contrast. Aorta: Normal caliber aorta without aneurysm, dissection, vasculitis or significant stenosis. Celiac: Patent without evidence of aneurysm, dissection, vasculitis or significant stenosis. SMA: Patent without evidence of aneurysm, dissection, vasculitis or significant stenosis. Renals: 2 right renal arteries are noted. The renal arteries are patent without evidence of aneurysm, dissection, vasculitis, fibromuscular dysplasia or significant stenosis. IMA: Patent without evidence of aneurysm, dissection, vasculitis or significant stenosis. Inflow: Patent without evidence of aneurysm, dissection, vasculitis or significant stenosis. Proximal Outflow: Bilateral common femoral and visualized portions of the superficial and profunda femoral arteries are patent without evidence of aneurysm, dissection, vasculitis or significant stenosis. Veins: The portal, splenic, superior mesenteric veins are patent. Inferior vena cava is patent. Review of the MIP images confirms the above findings. NON-VASCULAR Lower chest: No acute abnormality. Hepatobiliary: No focal liver abnormality. No gallstones, gallbladder wall thickening, or pericholecystic fluid. No biliary dilatation. Pancreas: No focal lesion. Normal pancreatic contour. No surrounding inflammatory changes. No main pancreatic ductal dilatation. Spleen: Normal in size without focal abnormality. Adrenals/Urinary Tract: No adrenal nodule bilaterally. Bilateral kidneys enhance symmetrically. No hydronephrosis. No hydroureter. The urinary bladder is unremarkable. Stomach/Bowel: Stomach is within normal limits. No evidence of bowel  wall thickening or dilatation. Appendix appears normal. Lymphatic: No lymphadenopathy. Reproductive: Likely nabothian cysts. Otherwise uterus and bilateral adnexa are unremarkable. Other: No intraperitoneal free fluid. No intraperitoneal free  gas. No organized fluid collection. Musculoskeletal: No abdominal wall hernia or abnormality. No suspicious lytic or blastic osseous lesions. No acute displaced fracture. IMPRESSION: VASCULAR 1. No acute vascular abnormality. 2.  No active bleeding identified on CT angiography. NON-VASCULAR 1. No acute intra-abdominal or intrapelvic abnormality. Electronically Signed   By: Iven Finn M.D.   On: 07/22/2021 20:45     Subjective: - no chest pain, shortness of breath, no abdominal pain, nausea or vomiting.   Discharge Exam: BP 134/75    Pulse 82    Temp 98.6 F (37 C) (Oral)    Resp 13    Ht 5' (1.524 m)    Wt 70.4 kg    LMP 09/13/2016    SpO2 98%    BMI 30.31 kg/m   General: Pt is alert, awake, not in acute distress Cardiovascular: RRR, S1/S2 +, no rubs, no gallops Respiratory: CTA bilaterally, no wheezing, no rhonchi Abdominal: Soft, NT, ND, bowel sounds + Extremities: no edema, no cyanosis   The results of significant diagnostics from this hospitalization (including imaging, microbiology, ancillary and laboratory) are listed below for reference.     Microbiology: Recent Results (from the past 240 hour(s))  Resp Panel by RT-PCR (Flu A&B, Covid) Nasopharyngeal Swab     Status: None   Collection Time: 07/23/21  9:10 PM   Specimen: Nasopharyngeal Swab; Nasopharyngeal(NP) swabs in vial transport medium  Result Value Ref Range Status   SARS Coronavirus 2 by RT PCR NEGATIVE NEGATIVE Final    Comment: (NOTE) SARS-CoV-2 target nucleic acids are NOT DETECTED.  The SARS-CoV-2 RNA is generally detectable in upper respiratory specimens during the acute phase of infection. The lowest concentration of SARS-CoV-2 viral copies this assay can detect is 138 copies/mL. A negative result does not preclude SARS-Cov-2 infection and should not be used as the sole basis for treatment or other patient management decisions. A negative result may occur with  improper specimen collection/handling,  submission of specimen other than nasopharyngeal swab, presence of viral mutation(s) within the areas targeted by this assay, and inadequate number of viral copies(<138 copies/mL). A negative result must be combined with clinical observations, patient history, and epidemiological information. The expected result is Negative.  Fact Sheet for Patients:  EntrepreneurPulse.com.au  Fact Sheet for Healthcare Providers:  IncredibleEmployment.be  This test is no t yet approved or cleared by the [place] FDA and  has been authorized for detection and/or diagnosis of SARS-CoV-2 by FDA under an Emergency Use Authorization (EUA). This EUA will remain  in effect (meaning this test can be used) for the duration of the COVID-19 declaration under Section 564(b)(1) of the Act, 21 U.S.C.section 360bbb-3(b)(1), unless the authorization is terminated  or revoked sooner.       Influenza A by PCR NEGATIVE NEGATIVE Final   Influenza B by PCR NEGATIVE NEGATIVE Final    Comment: (NOTE) The Xpert Xpress SARS-CoV-2/FLU/RSV plus assay is intended as an aid in the diagnosis of influenza from Nasopharyngeal swab specimens and should not be used as a sole basis for treatment. Nasal washings and aspirates are unacceptable for Xpert Xpress SARS-CoV-2/FLU/RSV testing.  Fact Sheet for Patients: EntrepreneurPulse.com.au  Fact Sheet for Healthcare Providers: IncredibleEmployment.be  This test is not yet approved or cleared by the [place] FDA and has been authorized for detection and/or diagnosis of  SARS-CoV-2 by FDA under an Emergency Use Authorization (EUA). This EUA will remain in effect (meaning this test can be used) for the duration of the COVID-19 declaration under Section 564(b)(1) of the Act, 21 U.S.C. section 360bbb-3(b)(1), unless the authorization is terminated or revoked.  Performed at [facility], [place] [address], [place], [place] [address]      Labs: Basic Metabolic Panel: Recent Labs  Lab 07/22/21 1755 07/23/21 1342  NA 143 139  K 4.1 4.4  CL 110 107  CO2 24 24  GLUCOSE 108* 94  BUN 8 7  CREATININE 0.75 0.69  CALCIUM 9.6 9.2   Liver Function Tests: Recent Labs  Lab 07/22/21 1755 07/23/21 1342  AST 21 19  ALT 14 12  ALKPHOS 61 68  BILITOT 0.3 0.3  PROT 7.9 7.4  ALBUMIN 4.0 3.8   CBC: Recent Labs  Lab 07/22/21 1755 07/23/21 1342 07/24/21 0536  WBC 6.0 4.4 4.0  NEUTROABS 3.7  --   --   HGB 7.6* 7.5* 9.0*  HCT 25.8* 24.8* 29.0*  MCV 86.6 85.2 87.1  PLT 326 310 272   CBG: No results for input(s): GLUCAP in the last 168 hours. Hgb A1c No results for input(s): HGBA1C in the last 72 hours. Lipid Profile No results for input(s): CHOL, HDL, LDLCALC, TRIG, CHOLHDL, LDLDIRECT in the last 72 hours. Thyroid function studies No results for input(s): TSH, T4TOTAL, T3FREE, THYROIDAB in the last 72 hours.  Invalid input(s): FREET3 Urinalysis    Component Value Date/Time   COLORURINE COLORLESS (A) 03/10/2017 1711   APPEARANCEUR CLEAR 03/10/2017 1711   LABSPEC 1.010 09/29/2020 1727   PHURINE 5.5 09/29/2020 1727   GLUCOSEU NEGATIVE 09/29/2020 1727   HGBUR SMALL (A) 09/29/2020 1727   BILIRUBINUR NEGATIVE 09/29/2020 1727   KETONESUR NEGATIVE 09/29/2020 1727   PROTEINUR NEGATIVE 09/29/2020 1727   UROBILINOGEN 0.2 09/29/2020 1727   NITRITE NEGATIVE 09/29/2020 1727   LEUKOCYTESUR NEGATIVE 09/29/2020 1727    FURTHER DISCHARGE INSTRUCTIONS:   Get Medicines reviewed and adjusted: Please take all your medications with you for your next visit with your Primary MD   Laboratory/radiological data: Please request your Primary MD to go over all hospital tests and procedure/radiological results at the follow up, please ask your Primary MD to get all Hospital records sent to his/her office.   In some cases, they will be blood work, cultures and biopsy results pending at the  time of your discharge. Please request that your primary care M.D. goes through all the records of your hospital data and follows up on these results.   Also Note the following: If you experience worsening of your admission symptoms, develop shortness of breath, life threatening emergency, suicidal or homicidal thoughts you must seek medical attention immediately by calling 911 or calling your MD immediately  if symptoms less severe.   You must read complete instructions/literature along with all the possible adverse reactions/side effects for all the Medicines you take and that have been prescribed to you. Take any new Medicines after you have completely understood and accpet all the possible adverse reactions/side effects.    Do not drive when taking Pain medications or sleeping medications (Benzodaizepines)   Do not take more than prescribed Pain, Sleep and Anxiety Medications. It is not advisable to combine anxiety,sleep and pain medications without talking with your primary care practitioner   Special Instructions: If you have smoked or chewed Tobacco  in the last 2 yrs please stop smoking, stop any regular  Alcohol  and or any Recreational drug use.   Wear Seat belts while driving.   Please note: You were cared for by a hospitalist during your hospital stay. Once you are discharged, your primary care physician will handle any further medical issues. Please note that NO REFILLS for any discharge medications will be authorized once you are discharged, as it is imperative that you return to your primary care physician (or establish a relationship with a primary care physician if you do not have one) for your post hospital discharge needs so that they can reassess your need for medications and monitor your lab values.  Time coordinating discharge: 40 minutes  SIGNED:  Marzetta Board, MD, PhD 07/24/2021, 12:31 PM

## 2021-07-24 NOTE — ED Notes (Signed)
MD at bedside assessing pt at this time.

## 2021-07-25 LAB — BPAM RBC
Blood Product Expiration Date: 202303182359
ISSUE DATE / TIME: 202302170117
Unit Type and Rh: 5100

## 2021-07-25 LAB — TYPE AND SCREEN
ABO/RH(D): O POS
Antibody Screen: NEGATIVE
Unit division: 0

## 2022-08-12 ENCOUNTER — Telehealth: Payer: Self-pay

## 2022-08-12 NOTE — Telephone Encounter (Signed)
Mychart msg sent

## 2023-03-07 DIAGNOSIS — R109 Unspecified abdominal pain: Secondary | ICD-10-CM | POA: Diagnosis not present

## 2023-03-07 DIAGNOSIS — Z8619 Personal history of other infectious and parasitic diseases: Secondary | ICD-10-CM | POA: Diagnosis not present

## 2023-05-20 ENCOUNTER — Ambulatory Visit (INDEPENDENT_AMBULATORY_CARE_PROVIDER_SITE_OTHER): Payer: Medicaid Other

## 2023-05-20 ENCOUNTER — Encounter (HOSPITAL_COMMUNITY): Payer: Self-pay

## 2023-05-20 ENCOUNTER — Ambulatory Visit (HOSPITAL_COMMUNITY)
Admission: EM | Admit: 2023-05-20 | Discharge: 2023-05-20 | Disposition: A | Discharge status: Home / Self Care | Payer: Medicaid Other | Attending: Emergency Medicine | Admitting: Emergency Medicine

## 2023-05-20 DIAGNOSIS — R051 Acute cough: Secondary | ICD-10-CM | POA: Diagnosis not present

## 2023-05-20 DIAGNOSIS — R0689 Other abnormalities of breathing: Secondary | ICD-10-CM | POA: Diagnosis not present

## 2023-05-20 MED ORDER — AZITHROMYCIN 250 MG PO TABS: 250.0000 mg | ORAL_TABLET | ORAL | 0 refills | Status: AC

## 2023-05-20 MED ORDER — BENZONATATE 100 MG PO CAPS: 100.0000 mg | ORAL_CAPSULE | Freq: Three times a day (TID) | ORAL | 0 refills | Status: AC | PRN

## 2023-05-20 MED ORDER — PREDNISONE 20 MG PO TABS: 40.0000 mg | ORAL_TABLET | Freq: Every day | ORAL | 0 refills | Status: AC

## 2023-05-20 NOTE — ED Provider Notes (Signed)
[facility]    CSN: [id number] Arrival date & time: 05/20/23  1333      History   Chief Complaint Chief Complaint  Patient presents with   Cough    HPI Margaret Morales is a 52 y.o. female.  Medical interpretor used for encounter 3 day history of productive cough Feels congestion in the chest, worse at night when flat Having chills but no fevers Denies shortness of breath or wheezing\ No lung history Denies recent travel or sick contacts   History reviewed. No pertinent past medical history.  Patient Active Problem List   Diagnosis Date Noted   H. pylori infection 07/24/2021   Symptomatic anemia    Hemorrhoids    Acute lower GI bleeding 07/23/2021    History reviewed. No pertinent surgical history.  OB History   No obstetric history on file.      Home Medications    Prior to Admission medications   Medication Sig Start Date End Date Taking? Authorizing Provider  azithromycin (ZITHROMAX) 250 MG tablet Take 1 tablet (250 mg total) by mouth as directed. Take 2 tablets together on day 1, then take 1 tablet daily for 4 days 05/20/23  Yes Jeramia Saleeby, Lurena Joiner, PA-C  benzonatate (TESSALON) 100 MG capsule Take 1 capsule (100 mg total) by mouth 3 (three) times daily as needed for cough. 05/20/23  Yes Shahara Hartsfield, Lurena Joiner, PA-C  predniSONE (DELTASONE) 20 MG tablet Take 2 tablets (40 mg total) by mouth daily with breakfast for 5 days. 05/20/23 05/25/23 Yes Karol Liendo, Lurena Joiner, PA-C  dicyclomine (BENTYL) 20 MG tablet Take 1 tablet (20 mg total) by mouth 2 (two) times daily. Patient not taking: Reported on 03/10/2017 01/13/17 12/01/18  Jaynie Crumble, PA-C  diphenhydrAMINE (BENADRYL) 25 MG tablet Take 1 tablet (25 mg total) by mouth at bedtime as needed for sleep. 12/01/18 09/29/20  Georgetta Haber, NP  esomeprazole (NEXIUM) 40 MG capsule Take 1 capsule (40 mg total) by mouth daily. 12/01/18 09/29/20  Georgetta Haber, NP  famotidine (PEPCID) 20 MG tablet Take 1 tablet (20 mg total) by  mouth at bedtime. 12/01/18 09/29/20  Georgetta Haber, NP  omeprazole (PRILOSEC OTC) 20 MG tablet Take 20 mg by mouth 2 (two) times daily.  11/19/18  [provider]  sucralfate (CARAFATE) 1 g tablet Take 1 tablet (1 g total) by mouth 4 (four) times daily -  with meals and at bedtime. 12/25/18 12/25/18  Eustace Moore, MD    Family History Family History  Family history unknown: Yes    Social History Social History   Tobacco Use   Smoking status: Never   Smokeless tobacco: Never  Vaping Use   Vaping status: Never Used  Substance Use Topics   Alcohol use: No     Allergies   Patient has no known allergies.   Review of Systems Review of Systems  Respiratory:  Positive for cough.      Physical Exam Triage Vital Signs ED Triage Vitals [05/20/23 1359]  Encounter Vitals Group     BP (!) 145/86     Systolic BP Percentile      Diastolic BP Percentile      Pulse Rate 86     Resp 18     Temp 98.6 F (37 C)     Temp Source Oral     SpO2 98 %     Weight      Height      Head Circumference      Peak Flow  Pain Score      Pain Loc      Pain Education      Exclude from Growth Chart    No data found.  Updated Vital Signs BP (!) 145/86 (BP Location: Left Arm)   Pulse 86   Temp 98.6 F (37 C) (Oral)   Resp 18   LMP 09/13/2016   SpO2 98%      Physical Exam Vitals and nursing note reviewed.  Constitutional:      General: She is not in acute distress. HENT:     Right Ear: Tympanic membrane and ear canal normal.     Left Ear: Tympanic membrane and ear canal normal.     Nose: No rhinorrhea.     Mouth/Throat:     Mouth: Mucous membranes are moist.     Pharynx: Oropharynx is clear. No posterior oropharyngeal erythema.  Eyes:     Conjunctiva/sclera: Conjunctivae normal.  Cardiovascular:     Rate and Rhythm: Normal rate and regular rhythm.     Pulses: Normal pulses.     Heart sounds: Normal heart sounds.  Pulmonary:     Effort: Pulmonary effort is  normal.     Breath sounds: Wheezing and rales present.     Comments: Wheezing/crackling heard in bases, L > R Musculoskeletal:     Cervical back: Normal range of motion.  Lymphadenopathy:     Cervical: No cervical adenopathy.  Skin:    General: Skin is warm and dry.  Neurological:     Mental Status: She is alert and oriented to person, place, and time.     UC Treatments / Results  Labs (all labs ordered are listed, but only abnormal results are displayed) Labs Reviewed - No data to display  EKG   Radiology DG Chest 2 View Result Date: 05/20/2023 CLINICAL DATA:  Productive cough for 3 days. Wheezing abnormal breath sounds. EXAM: CHEST - 2 VIEW COMPARISON:  12/01/2018 FINDINGS: The heart size and mediastinal contours are within normal limits. Both lungs are clear. The visualized skeletal structures are unremarkable. IMPRESSION: No active cardiopulmonary disease. Electronically Signed   By: Danae Orleans M.D.   On: 05/20/2023 16:45    Procedures Procedures (including critical care time)  Medications Ordered in UC Medications - No data to display  Initial Impression / Assessment and Plan / UC Course  I have reviewed the triage vital signs and the nursing notes.  Pertinent labs & imaging results that were available during my care of the patient were reviewed by me and considered in my medical decision making (see chart for details).   Chest xray with possible left lower lobe infiltrate on preliminary read by this provider. New compared to 2020 imaging.  Azithromycin 500 mg 1 dose then 250 mg x 4 days Prednisone 40 mg x 5 days Tessalon TID prn Discussed medications in depth. Return and ED precautions discussed. Patient agrees to plan   5:22 PM Radiology reads imaging as negative. With patient adventitious sounds I would like her to still take the antibiotic. No change to therapy   Final Clinical Impressions(s) / UC Diagnoses   Final diagnoses:  Acute cough  Adventitious  breath sounds     Discharge Instructions      Please take the prednisone 40 mg daily for the next 5 days The tessalon cough pills can be taken 3x daily. If this medication makes you drowsy, take only one pill before bed. Please take the antibiotic azithromycin; 2 pills today, and then  1 pill daily for 4 days. Please go to the emergency department if symptoms worsen.  ??????????? ????? ? ???????? prednisone 40 mg ??????? ???????? Tessalon ??????????????????????? ?????? ? ????? ???????????????? ??????? ??????? ??????????????? ???????????? ???????????? ???????? ??????????? ????????? azithromycin ??????????? ????? 2 ???? ? ???????? 1 ???? ???? 4 ???? ??????????????? ???????????? ??????????????? ??????? kyaayyjuupyu nout  5 raataatwat prednisone 40 mg tanaeko  sout par . Tessalon hkyaunggsoepyawwatsayyko taitnae  3 kyaain  sout ninepartaal .  i sayysai  sangaarr  ngite myain hcay park  aiutrar mawainme  sayy taitlonesar  sout par. Lorelei Pont  pati jewasayy azithromycin  ko sout par . denae 2 lone  , pyeetot 1 lone tanae 4  raat  rawgarlakhkanarmyarr  posoelar park  aarayypawhtarnashoet  swarr par .     ED Prescriptions     Medication Sig Dispense Auth. Provider   predniSONE (DELTASONE) 20 MG tablet Take 2 tablets (40 mg total) by mouth daily with breakfast for 5 days. 10 tablet Daleyssa Loiselle, PA-C   benzonatate (TESSALON) 100 MG capsule Take 1 capsule (100 mg total) by mouth 3 (three) times daily as needed for cough. 30 capsule Punam Broussard, PA-C   azithromycin (ZITHROMAX) 250 MG tablet Take 1 tablet (250 mg total) by mouth as directed. Take 2 tablets together on day 1, then take 1 tablet daily for 4 days 6 tablet Anastasha Ortez, Lurena Joiner, PA-C      PDMP not reviewed this encounter.   Brax Walen, Lurena Joiner, [place] 05/20/23 1725

## 2023-05-20 NOTE — Discharge Instructions (Addendum)
Please take the prednisone 40 mg daily for the next 5 days The tessalon cough pills can be taken 3x daily. If this medication makes you drowsy, take only one pill before bed. Please take the antibiotic azithromycin; 2 pills today, and then 1 pill daily for 4 days. Please go to the emergency department if symptoms worsen.  ??????????? ????? ? ???????? prednisone 40 mg ??????? ???????? Tessalon ??????????????????????? ?????? ? ????? ???????????????? ??????? ??????? ??????????????? ???????????? ???????????? ???????? ??????????? ????????? azithromycin ??????????? ????? 2 ???? ? ???????? 1 ???? ???? 4 ???? ??????????????? ???????????? ??????????????? ??????? kyaayyjuupyu nout  5 raataatwat prednisone 40 mg tanaeko  sout par . Tessalon hkyaunggsoepyawwatsayyko taitnae  3 kyaain  sout ninepartaal .  i sayysai  sangaarr  ngite myain hcay park  aiutrar mawainme  sayy taitlonesar  sout par. Lorelei [address]  pati jewasayy azithromycin  ko sout par . denae 2 lone  , pyeetot 1 lone tanae 4  raat  rawgarlakhkanarmyarr  posoelar park  aarayypawhtarnashoet  swarr par .

## 2023-05-20 NOTE — ED Triage Notes (Signed)
Pt presents with productive cough x 3 days. Pt is taking Flu and cold medication.

## 2023-05-20 NOTE — ED Triage Notes (Signed)
Also, patient is taking blood pressure medication. She does not know the name of the medication.

## 2023-07-11 DIAGNOSIS — Z1231 Encounter for screening mammogram for malignant neoplasm of breast: Secondary | ICD-10-CM | POA: Diagnosis not present

## 2023-07-11 DIAGNOSIS — R928 Other abnormal and inconclusive findings on diagnostic imaging of breast: Secondary | ICD-10-CM | POA: Diagnosis not present

## 2023-12-17 IMAGING — CT CT CTA ABD/PEL W/CM AND/OR W/O CM
2 of 16 series · 10 of 46 positions shown, 17 images · IV contrast (agent unspecified)
Comparison: CT abdomen pelvis 03/11/2017

CLINICAL DATA: GI bleed, lower.  Diarrhea.  Bloody stools

EXAM:
CTA ABDOMEN AND PELVIS WITHOUT AND WITH CONTRAST
TECHNIQUE: Multidetector CT imaging of the abdomen and pelvis was performed
using the standard protocol during bolus administration of
intravenous contrast. Multiplanar reconstructed images and MIPs were
obtained and reviewed to evaluate the vascular anatomy.

[Series 14: cor · coronal · 0.78mm/px · 1 of 142 slices shown, 2 images]
[im 71/142  soft-tissue]
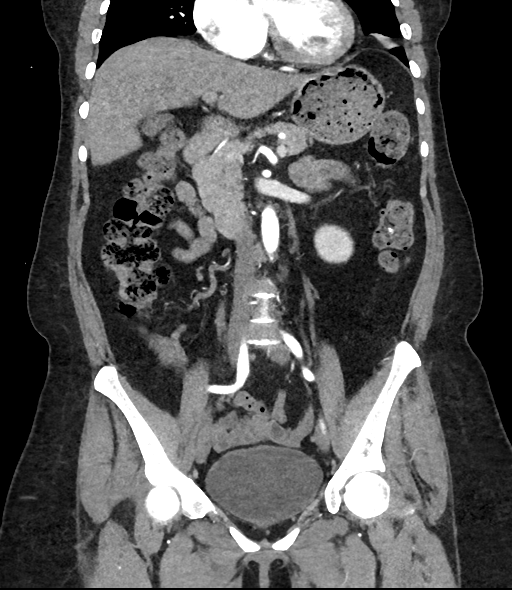
[im 71/142  bone]
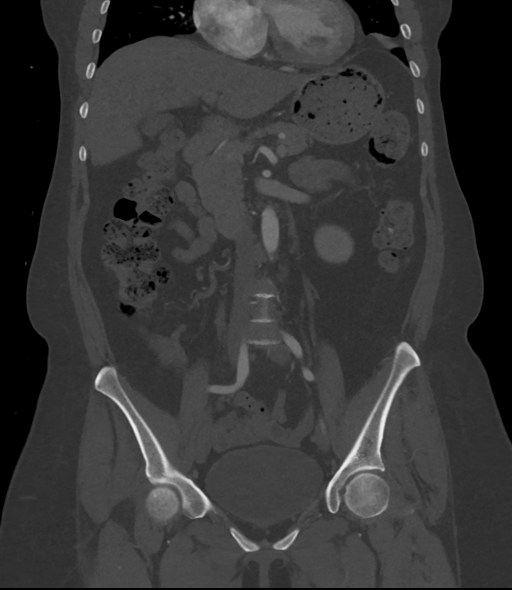

[Series 18: venous thins · axial · portal-venous · 0.76mm/px · z∈[+659,+1034]mm · 9 of 1147 slices shown, 15 images]
[im 105/1147  soft-tissue]
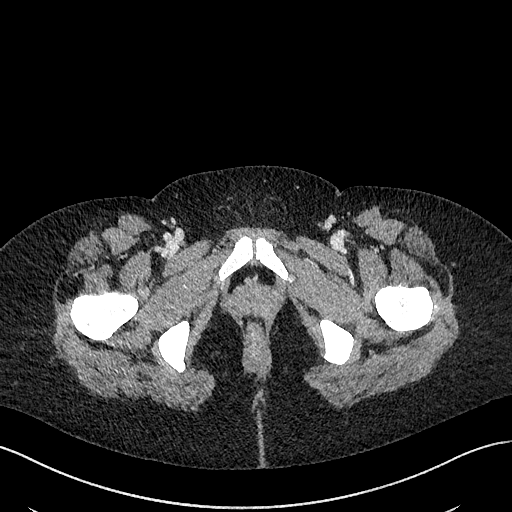
[im 105/1147  bone]
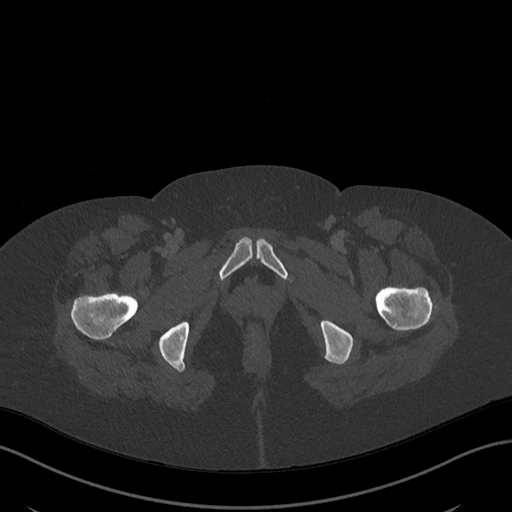
[im 209/1147  soft-tissue]
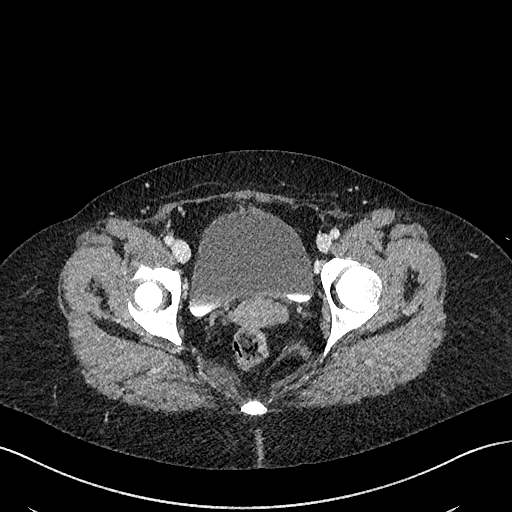
[im 313/1147  soft-tissue]
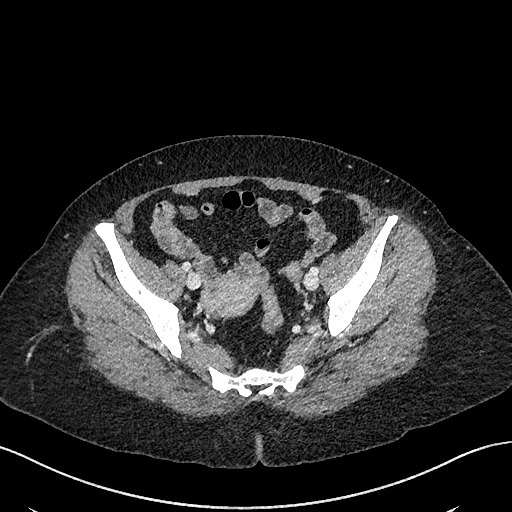
[im 417/1147  soft-tissue]
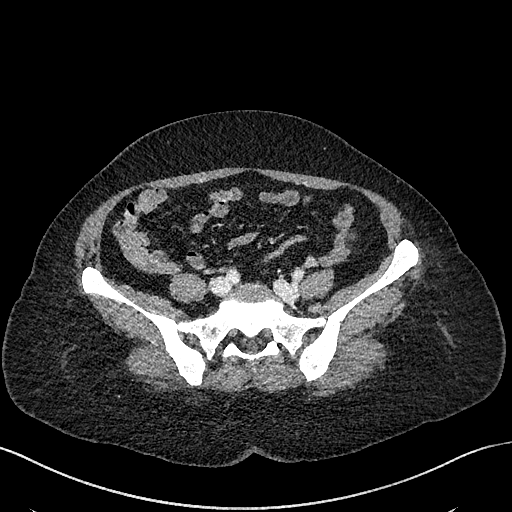
[im 626/1147  soft-tissue]
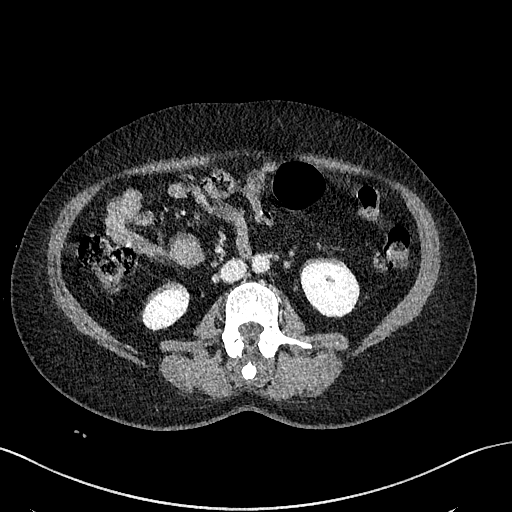
[im 730/1147  soft-tissue]
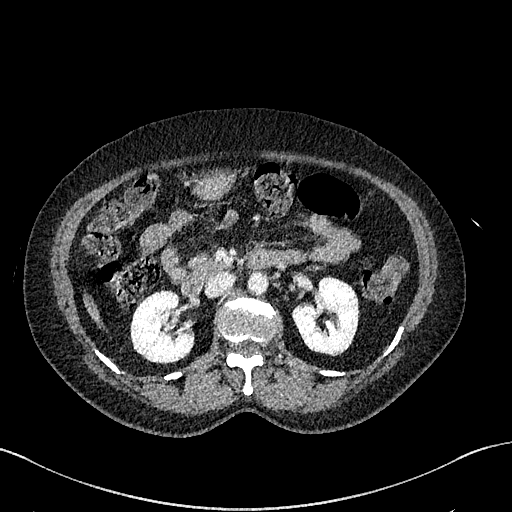
[im 730/1147  lung]
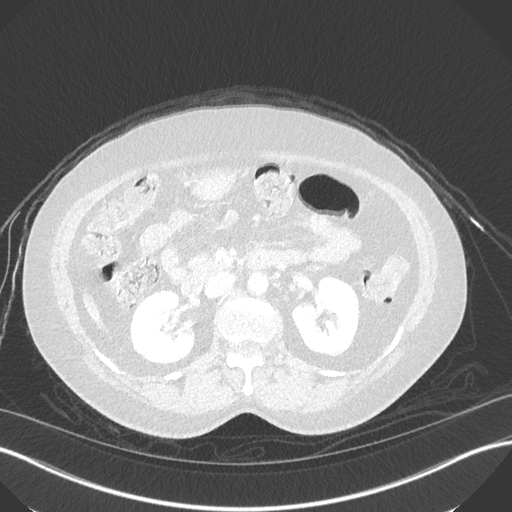
[im 834/1147  soft-tissue]
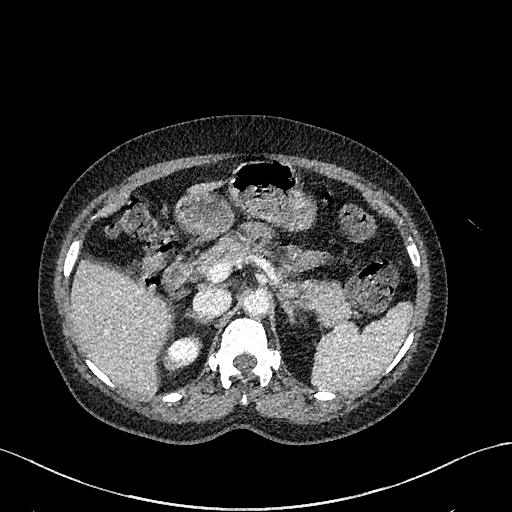
[im 834/1147  lung]
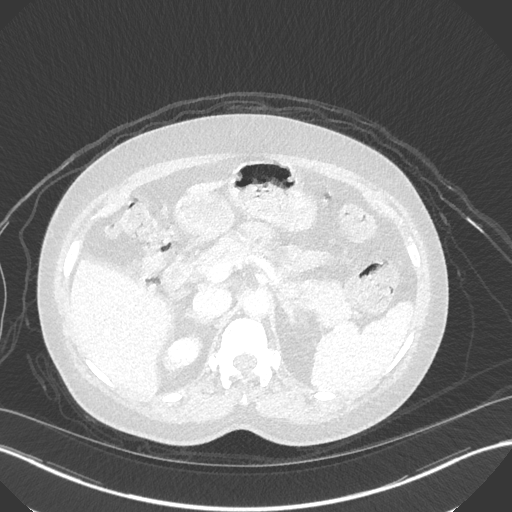
[im 938/1147  soft-tissue]
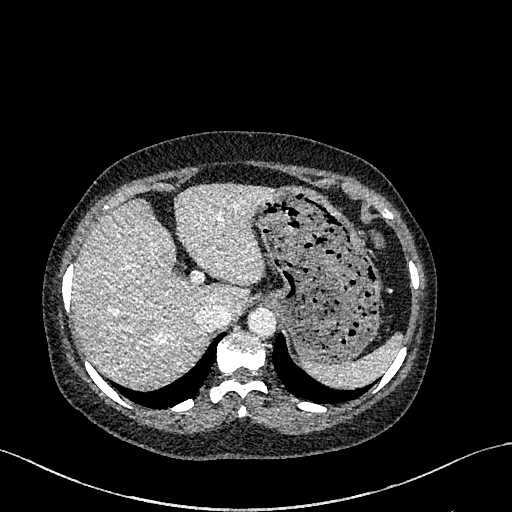
[im 938/1147  lung]
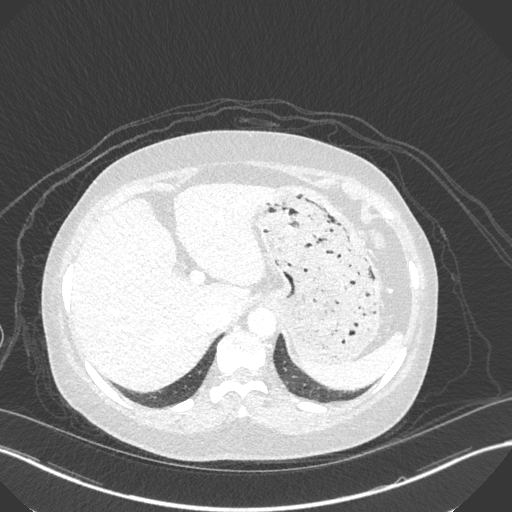
[im 1042/1147  soft-tissue]
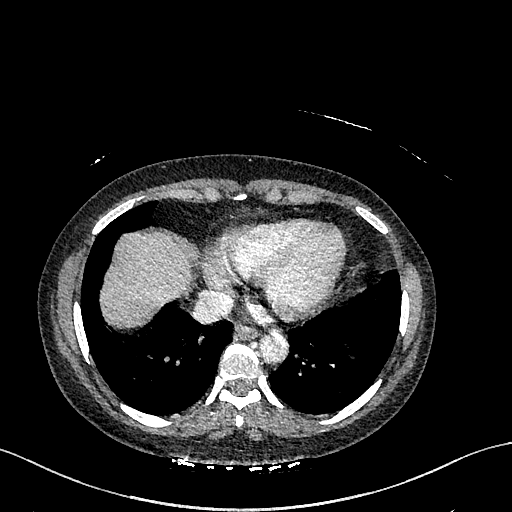
[im 1042/1147  lung]
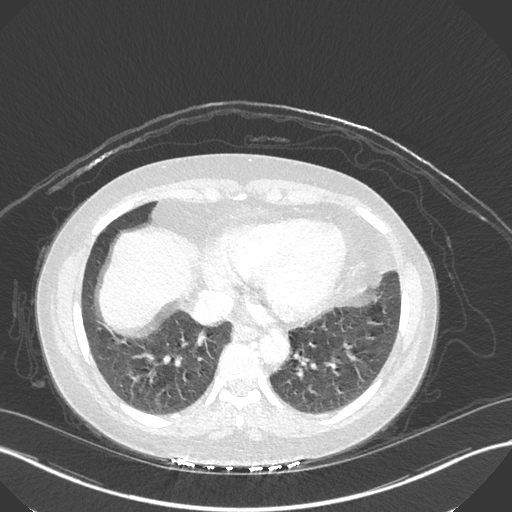
[im 1042/1147  bone]
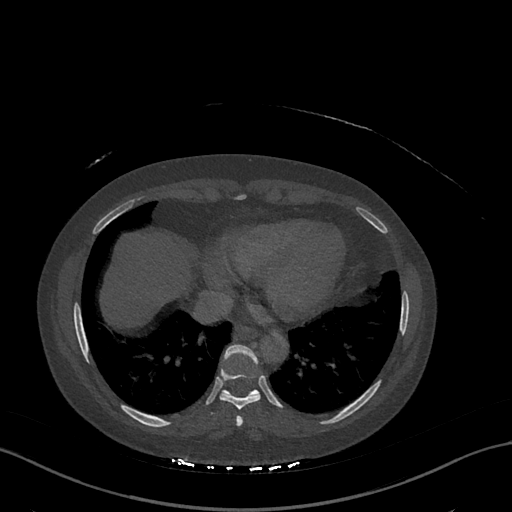

[10 of 46 positions shown; findings below may reference images not displayed]

RADIATION DOSE REDUCTION: This exam was performed according to the
departmental dose-optimization program which includes automated
exposure control, adjustment of the mA and/or kV according to
patient size and/or use of iterative reconstruction technique.

CONTRAST:  100mL OMNIPAQUE IOHEXOL 350 MG/ML SOLN
FINDINGS: VASCULAR

No active extravasation of intravenous contrast.

Aorta: Normal caliber aorta without aneurysm, dissection, vasculitis
or significant stenosis.

Celiac: Patent without evidence of aneurysm, dissection, vasculitis
or significant stenosis.

SMA: Patent without evidence of aneurysm, dissection, vasculitis or
significant stenosis.

Renals: 2 right renal arteries are noted. The renal arteries are
patent without evidence of aneurysm, dissection, vasculitis,
fibromuscular dysplasia or significant stenosis.

IMA: Patent without evidence of aneurysm, dissection, vasculitis or
significant stenosis.

Inflow: Patent without evidence of aneurysm, dissection, vasculitis
or significant stenosis.

Proximal Outflow: Bilateral common femoral and visualized portions
of the superficial and profunda femoral arteries are patent without
evidence of aneurysm, dissection, vasculitis or significant
stenosis.

Veins: The portal, splenic, superior mesenteric veins are patent.
Inferior vena cava is patent.

Review of the MIP images confirms the above findings.

NON-VASCULAR

Lower chest: No acute abnormality.

Hepatobiliary: No focal liver abnormality. No gallstones,
gallbladder wall thickening, or pericholecystic fluid. No biliary
dilatation.

Pancreas: No focal lesion. Normal pancreatic contour. No surrounding
inflammatory changes. No main pancreatic ductal dilatation.

Spleen: Normal in size without focal abnormality.

Adrenals/Urinary Tract:

No adrenal nodule bilaterally.

Bilateral kidneys enhance symmetrically.

No hydronephrosis. No hydroureter.

The urinary bladder is unremarkable.

Stomach/Bowel: Stomach is within normal limits. No evidence of bowel
wall thickening or dilatation. Appendix appears normal.

Lymphatic: No lymphadenopathy.

Reproductive: Likely nabothian cysts. Otherwise uterus and bilateral
adnexa are unremarkable.

Other: No intraperitoneal free fluid. No intraperitoneal free gas.
No organized fluid collection.

Musculoskeletal:

No abdominal wall hernia or abnormality.

No suspicious lytic or blastic osseous lesions. No acute displaced
fracture.
IMPRESSION: VASCULAR

1. No acute vascular abnormality.
2.  No active bleeding identified on CT angiography.

NON-VASCULAR

1. No acute intra-abdominal or intrapelvic abnormality.

## 2024-03-12 DIAGNOSIS — E782 Mixed hyperlipidemia: Secondary | ICD-10-CM | POA: Diagnosis not present

## 2024-03-12 DIAGNOSIS — R1030 Lower abdominal pain, unspecified: Secondary | ICD-10-CM | POA: Diagnosis not present

## 2024-03-12 DIAGNOSIS — K5900 Constipation, unspecified: Secondary | ICD-10-CM | POA: Diagnosis not present

## 2024-03-12 DIAGNOSIS — R5383 Other fatigue: Secondary | ICD-10-CM | POA: Diagnosis not present

## 2024-03-12 DIAGNOSIS — Z789 Other specified health status: Secondary | ICD-10-CM | POA: Diagnosis not present

## 2024-03-12 DIAGNOSIS — K219 Gastro-esophageal reflux disease without esophagitis: Secondary | ICD-10-CM | POA: Diagnosis not present

## 2024-03-12 DIAGNOSIS — Z131 Encounter for screening for diabetes mellitus: Secondary | ICD-10-CM | POA: Diagnosis not present

## 2024-03-12 DIAGNOSIS — E669 Obesity, unspecified: Secondary | ICD-10-CM | POA: Diagnosis not present

## 2024-03-12 DIAGNOSIS — K649 Unspecified hemorrhoids: Secondary | ICD-10-CM | POA: Diagnosis not present
